# Patient Record
Sex: Male | Born: 1955 | Race: Black or African American | Hispanic: No | Smoking: Former smoker
Health system: Southern US, Community
[De-identification: ages and names within clinical notes are randomized; demographics above are authoritative.]

## PROBLEM LIST (undated history)

## (undated) DIAGNOSIS — I6529 Occlusion and stenosis of unspecified carotid artery: Secondary | ICD-10-CM

## (undated) DIAGNOSIS — I2510 Atherosclerotic heart disease of native coronary artery without angina pectoris: Secondary | ICD-10-CM

## (undated) DIAGNOSIS — I639 Cerebral infarction, unspecified: Secondary | ICD-10-CM

## (undated) DIAGNOSIS — I10 Essential (primary) hypertension: Secondary | ICD-10-CM

## (undated) DIAGNOSIS — I219 Acute myocardial infarction, unspecified: Secondary | ICD-10-CM

## (undated) HISTORY — DX: Occlusion and stenosis of unspecified carotid artery: I65.29

## (undated) HISTORY — PX: OTHER SURGICAL HISTORY: SHX169

---

## 1999-09-19 ENCOUNTER — Encounter: Payer: Self-pay | Admitting: Emergency Medicine

## 1999-09-19 ENCOUNTER — Observation Stay (HOSPITAL_COMMUNITY): Admission: EM | Admit: 1999-09-19 | Discharge: 1999-09-20 | Payer: Self-pay | Admitting: Emergency Medicine

## 1999-09-20 ENCOUNTER — Encounter: Payer: Self-pay | Admitting: Periodontics

## 2005-12-27 DIAGNOSIS — I219 Acute myocardial infarction, unspecified: Secondary | ICD-10-CM | POA: Insufficient documentation

## 2005-12-27 DIAGNOSIS — I10 Essential (primary) hypertension: Secondary | ICD-10-CM

## 2005-12-27 DIAGNOSIS — I69959 Hemiplegia and hemiparesis following unspecified cerebrovascular disease affecting unspecified side: Secondary | ICD-10-CM | POA: Insufficient documentation

## 2005-12-27 DIAGNOSIS — I639 Cerebral infarction, unspecified: Secondary | ICD-10-CM

## 2005-12-27 HISTORY — DX: Cerebral infarction, unspecified: I63.9

## 2005-12-27 HISTORY — DX: Acute myocardial infarction, unspecified: I21.9

## 2005-12-27 HISTORY — PX: CARDIAC CATHETERIZATION: SHX172

## 2006-09-22 ENCOUNTER — Inpatient Hospital Stay (HOSPITAL_COMMUNITY): Admission: EM | Admit: 2006-09-22 | Discharge: 2006-09-26 | Payer: Self-pay | Admitting: Emergency Medicine

## 2007-03-11 ENCOUNTER — Emergency Department (HOSPITAL_COMMUNITY): Admission: EM | Admit: 2007-03-11 | Discharge: 2007-03-11 | Payer: Self-pay | Admitting: Emergency Medicine

## 2007-03-21 ENCOUNTER — Encounter: Admission: RE | Admit: 2007-03-21 | Discharge: 2007-03-21 | Payer: Self-pay | Admitting: General Surgery

## 2007-03-29 ENCOUNTER — Encounter (HOSPITAL_COMMUNITY): Admission: RE | Admit: 2007-03-29 | Discharge: 2007-03-29 | Payer: Self-pay | Admitting: Cardiology

## 2007-11-21 ENCOUNTER — Ambulatory Visit: Payer: Self-pay | Admitting: Infectious Disease

## 2007-11-21 ENCOUNTER — Inpatient Hospital Stay (HOSPITAL_COMMUNITY): Admission: EM | Admit: 2007-11-21 | Discharge: 2007-11-22 | Payer: Self-pay | Admitting: Emergency Medicine

## 2007-11-21 ENCOUNTER — Encounter (INDEPENDENT_AMBULATORY_CARE_PROVIDER_SITE_OTHER): Payer: Self-pay | Admitting: Emergency Medicine

## 2007-11-22 ENCOUNTER — Ambulatory Visit: Payer: Self-pay | Admitting: *Deleted

## 2007-11-22 ENCOUNTER — Encounter (INDEPENDENT_AMBULATORY_CARE_PROVIDER_SITE_OTHER): Payer: Self-pay | Admitting: Gastroenterology

## 2008-03-23 IMAGING — CT CT PELVIS W/ CM
3 of 5 series · 15 of 32 positions shown, 19 images · IV contrast (OMNI 300/WATER & 100 ML OMNI 300)
Comparison: none

HISTORY: Right lower abdomen pain, rectal bleeding

[Series 2: routine abdomen · axial · 0.86mm/px · z∈[-423,-188]mm · 3 of 93 slices shown, 7 images]
[im 24/93  soft-tissue]
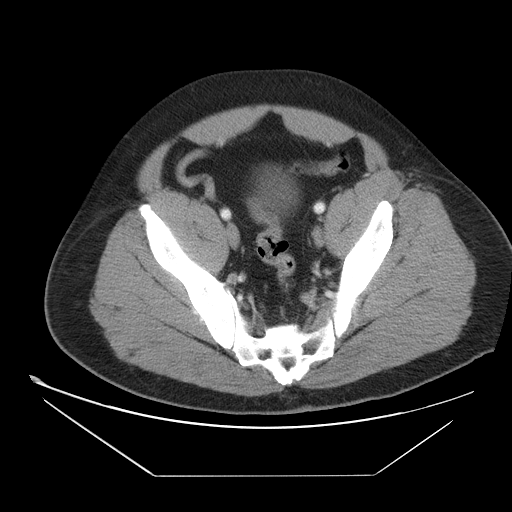
[im 24/93  lung]
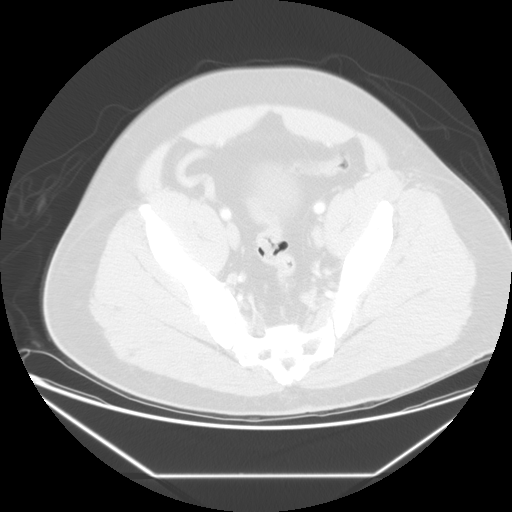
[im 24/93  bone]
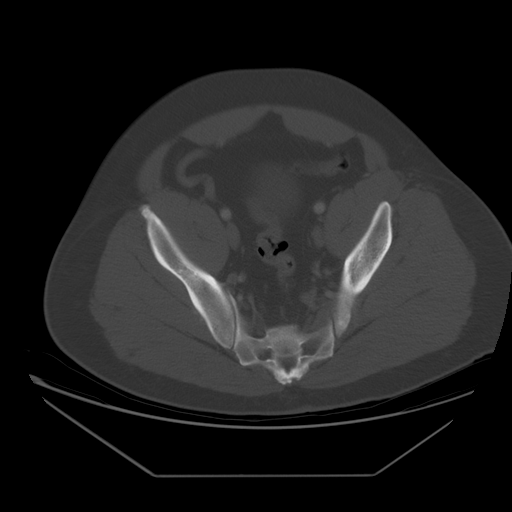
[im 47/93  soft-tissue]
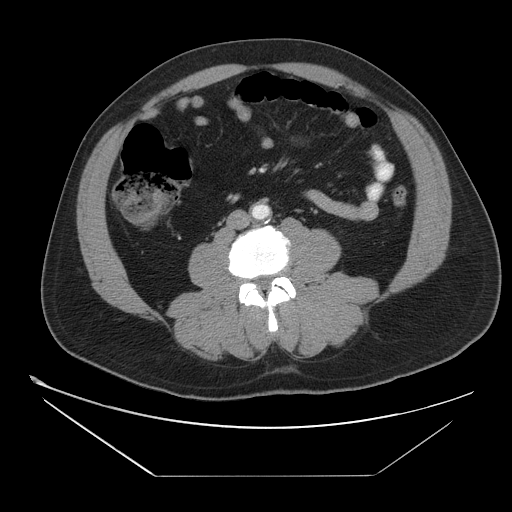
[im 47/93  lung]
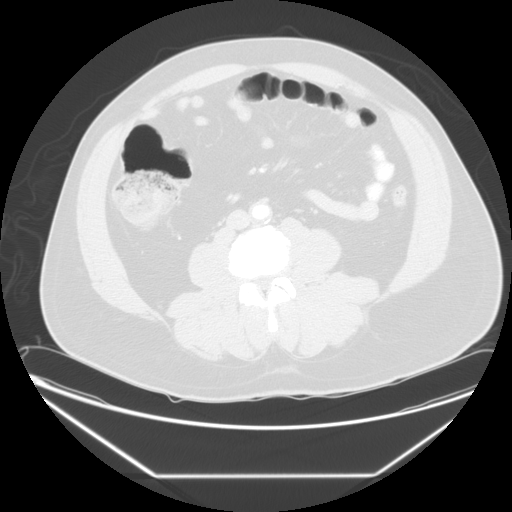
[im 70/93  soft-tissue]
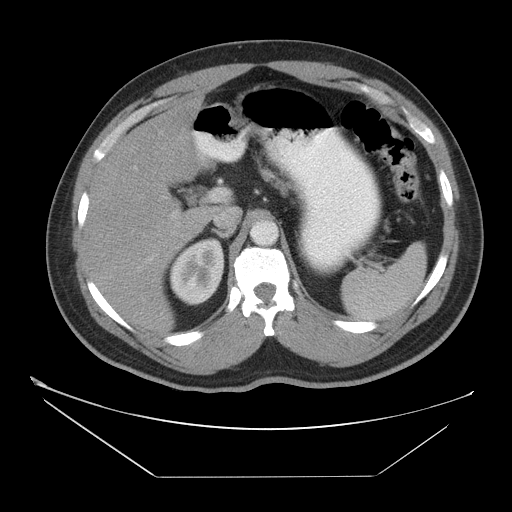
[im 70/93  lung]
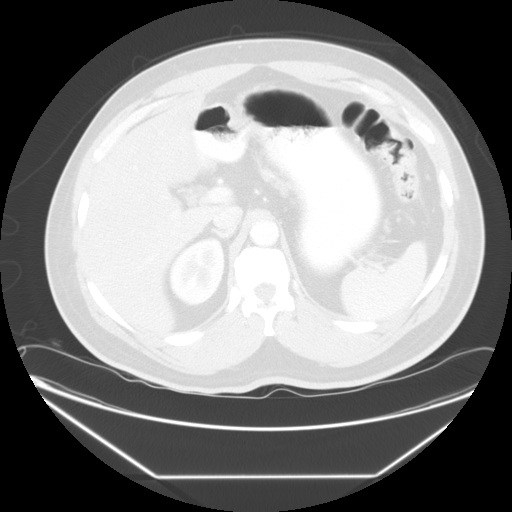

[Series 400: reformatted · sagittal · 0.95mm/px · 8 of 203 slices shown (1 of 2)]
[im 19/203  soft-tissue]
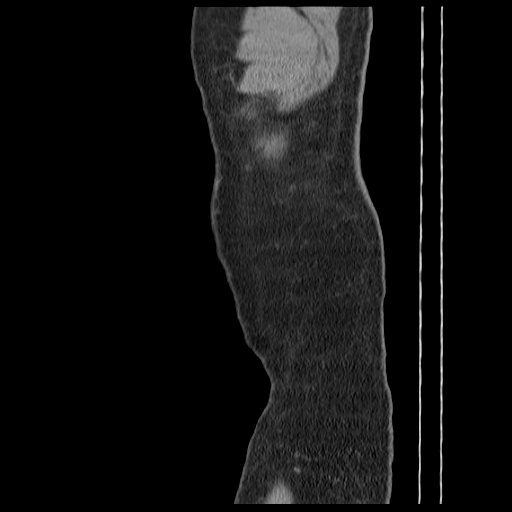
[im 37/203  soft-tissue]
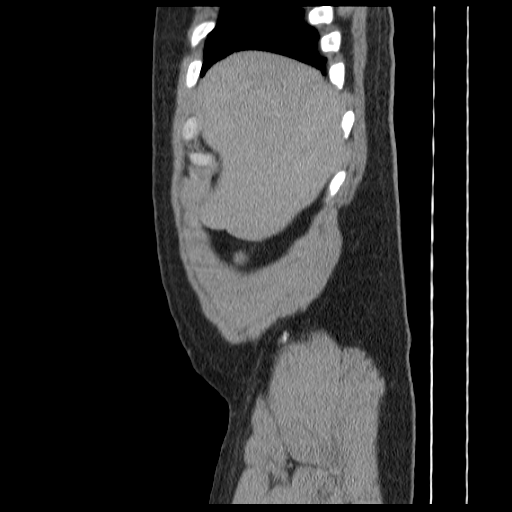
[im 74/203  soft-tissue]
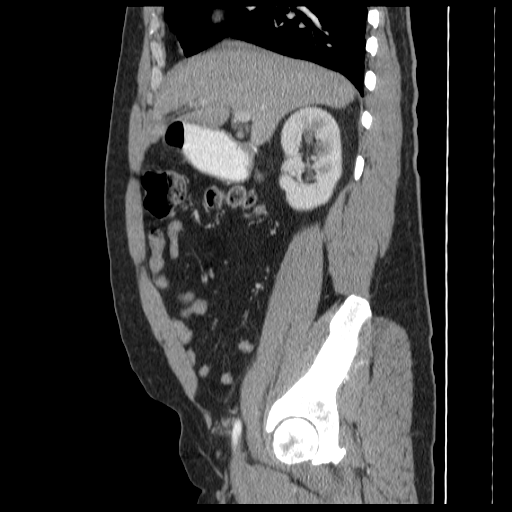
[im 92/203  soft-tissue]
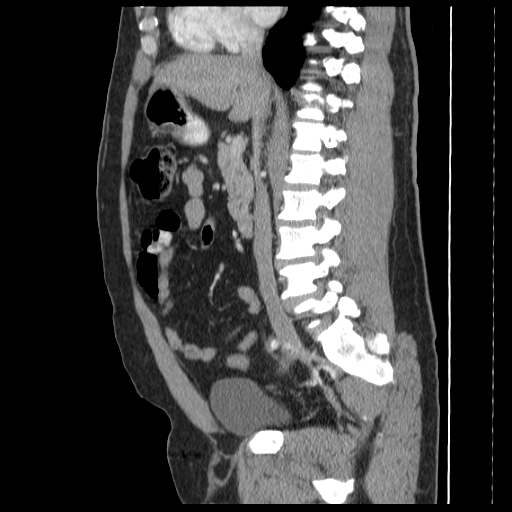
[im 111/203  soft-tissue]
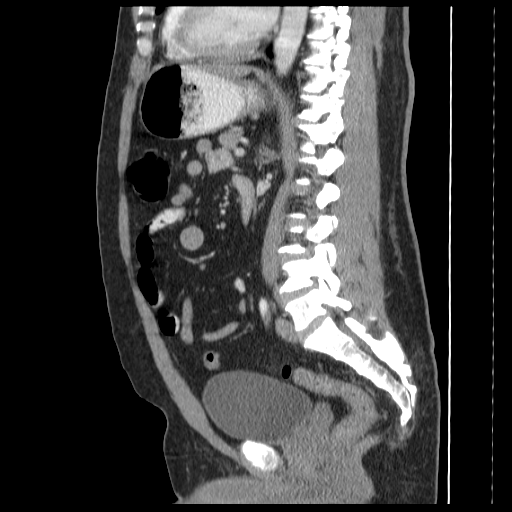
[im 129/203  soft-tissue]
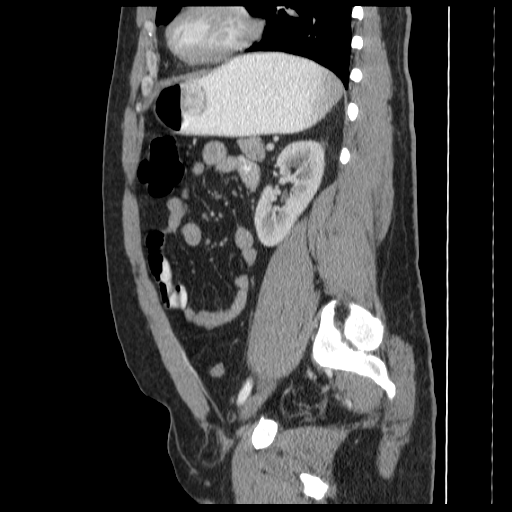
[im 166/203  soft-tissue]
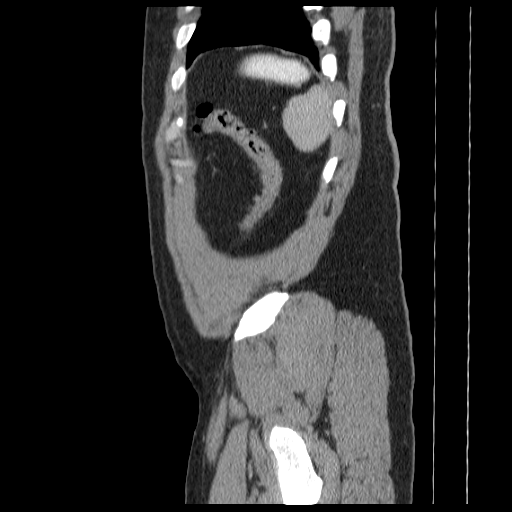
[im 184/203  soft-tissue]
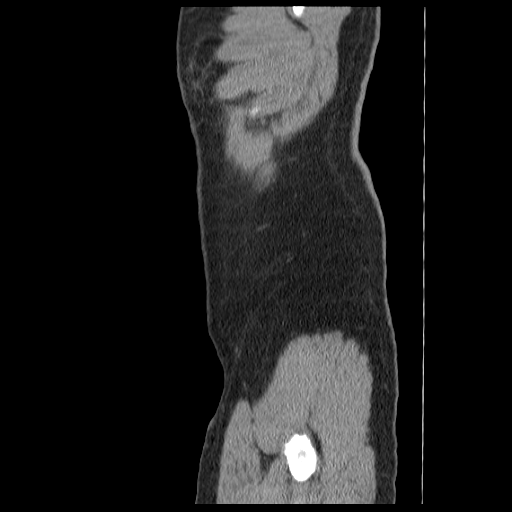

[Series 401: reformatted · coronal · 0.95mm/px · 4 of 168 slices shown (2 of 2)]
[im 19/168  soft-tissue]
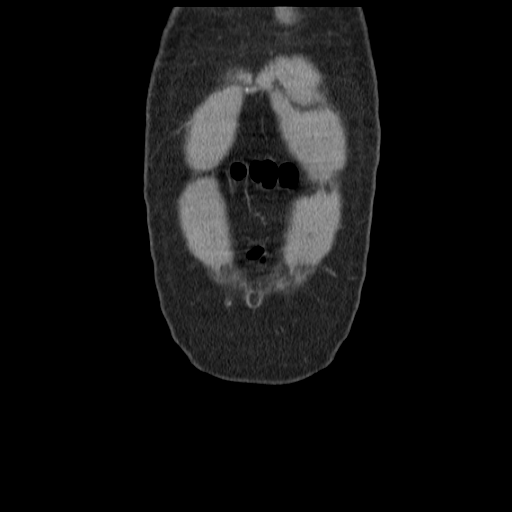
[im 38/168  soft-tissue]
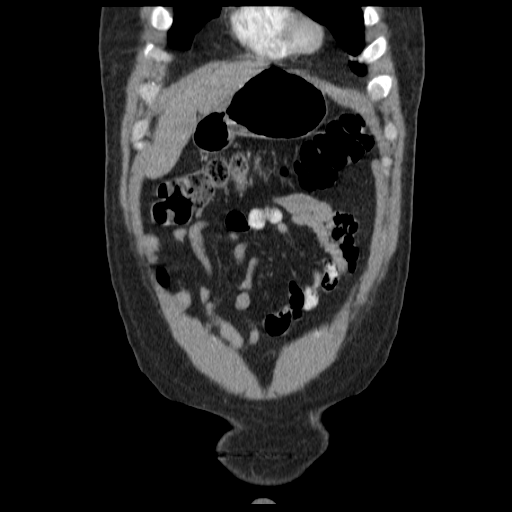
[im 56/168  soft-tissue]
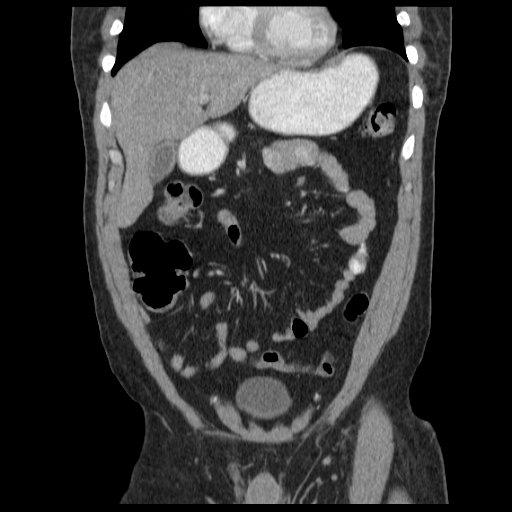
[im 75/168  soft-tissue]
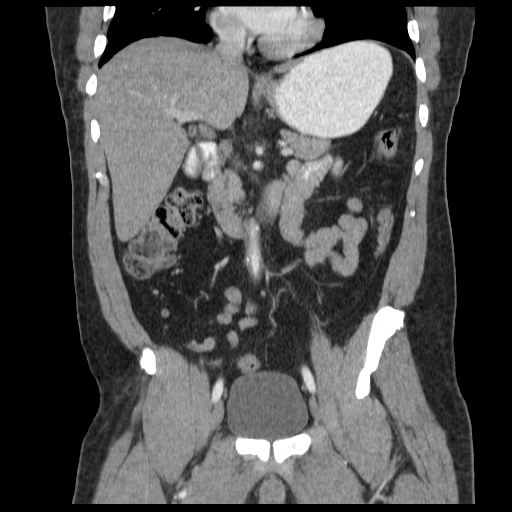

[15 of 32 positions shown; findings below may reference images not displayed]

CT ABDOMEN AND PELVIS WITH CONTRAST:

Multidetector helical CT imaging abdomen and pelvis performed.
Sagittal and coronal images are reconstructed from the axial data set.
Exam utilized dilute oral contrast and 100 cc 7mnipaque-YPP.
Comparison 09/22/2006

CT ABDOMEN:

Mild bibasilar atelectasis in lower lobes.
Bilateral renal cysts, largest in right kidney 2.5 x 1.8 cm image 29.
Nonobstructing calculus mid left kidney, 6 mm diameter image 36.
Partially contracted gallbladder with question thickened wall vs. artifact.
Remainder of liver, spleen, pancreas, kidneys, and adrenal glands normal.
Stomach and small bowel loops normal appearance.
Mild bowel thickening of proximal descending colon, with minimal pericolic
stranding, compatible with segmental colitis.
Remainder of colon unremarkable.
No upper abdominal mass, adenopathy, or free fluid.
IMPRESSION: Bilateral renal cyst with nonobstructing calculus midleft kidney.
Partially distended gallbladder with questionable gallbladder wall versus
artifact, recommend followup ultrasound of gallbladder if there is clinical
concern for hepatobiliary disease. 
Segmental wall thickening of the proximal descending colon with mild pericolic
inflammatory changes compatible with colitis, differential diagnosis includes
inflammatory bowel disease, infection, unlikely ischemia in patient of this age
with minimal vascular disease changes.

CT PELVIS:

Normal appendix.
Remainder of colon and pelvic small bowel loops normal appearance.
Normal appearance of bladder and distal ureters.
No pelvic mass, adenopathy, free fluid, or inflammatory process. 
Bones unremarkable.
IMPRESSION: No additional pelvic abnormalities, see above.

## 2008-05-23 ENCOUNTER — Emergency Department (HOSPITAL_COMMUNITY): Admission: EM | Admit: 2008-05-23 | Discharge: 2008-05-23 | Payer: Self-pay | Admitting: Emergency Medicine

## 2008-09-03 ENCOUNTER — Emergency Department (HOSPITAL_COMMUNITY): Admission: EM | Admit: 2008-09-03 | Discharge: 2008-09-04 | Payer: Self-pay | Admitting: Emergency Medicine

## 2010-05-20 ENCOUNTER — Inpatient Hospital Stay (HOSPITAL_COMMUNITY): Admission: EM | Admit: 2010-05-20 | Discharge: 2010-05-25 | Payer: Self-pay | Admitting: Emergency Medicine

## 2010-05-20 DIAGNOSIS — Z8719 Personal history of other diseases of the digestive system: Secondary | ICD-10-CM

## 2010-05-20 LAB — CONVERTED CEMR LAB
TSH: 2.877 microintl units/mL
Total CHOL/HDL Ratio: 8.8

## 2010-05-21 ENCOUNTER — Encounter (INDEPENDENT_AMBULATORY_CARE_PROVIDER_SITE_OTHER): Payer: Self-pay | Admitting: Cardiology

## 2010-05-21 ENCOUNTER — Ambulatory Visit: Payer: Self-pay | Admitting: Vascular Surgery

## 2010-05-21 ENCOUNTER — Encounter: Payer: Self-pay | Admitting: Cardiology

## 2010-05-24 ENCOUNTER — Encounter (INDEPENDENT_AMBULATORY_CARE_PROVIDER_SITE_OTHER): Payer: Self-pay | Admitting: Internal Medicine

## 2010-05-25 LAB — CONVERTED CEMR LAB
Basophils Absolute: 0 10*3/uL
Basophils Relative: 0 %
Chloride: 108 meq/L
HCT: 38.4 %
Hemoglobin: 12.5 g/dL
Lymphocytes Relative: 23 %
Lymphs Abs: 1.6 10*3/uL
MCV: 90.7 fL
Monocytes Absolute: 0.8 10*3/uL
Neutrophils Relative %: 61 %
Potassium: 3.9 meq/L
RBC: 4.23 M/uL
Sodium: 140 meq/L

## 2010-06-25 ENCOUNTER — Encounter (INDEPENDENT_AMBULATORY_CARE_PROVIDER_SITE_OTHER): Payer: Self-pay | Admitting: Nurse Practitioner

## 2010-06-25 ENCOUNTER — Ambulatory Visit: Payer: Self-pay | Admitting: Internal Medicine

## 2010-06-25 DIAGNOSIS — F17200 Nicotine dependence, unspecified, uncomplicated: Secondary | ICD-10-CM

## 2010-06-25 DIAGNOSIS — Z87442 Personal history of urinary calculi: Secondary | ICD-10-CM | POA: Insufficient documentation

## 2010-06-26 DIAGNOSIS — I739 Peripheral vascular disease, unspecified: Secondary | ICD-10-CM

## 2010-08-20 ENCOUNTER — Ambulatory Visit: Payer: Self-pay | Admitting: Nurse Practitioner

## 2011-01-07 ENCOUNTER — Encounter (HOSPITAL_COMMUNITY)
Admission: RE | Admit: 2011-01-07 | Discharge: 2011-01-26 | Payer: Self-pay | Source: Home / Self Care | Attending: Cardiology | Admitting: Cardiology
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
## 2011-01-26 NOTE — Letter (Signed)
Summary: PT INFORMATION SHEET  PT INFORMATION SHEET   Imported By: Arta Bruce 07/10/2010 12:42:14  _____________________________________________________________________  External Attachment:    Type:   Image     Comment:   External Document

## 2011-03-15 LAB — LIPID PANEL
Cholesterol: 256 mg/dL — ABNORMAL HIGH (ref 0–200)
HDL: 29 mg/dL — ABNORMAL LOW (ref >39)
Triglycerides: 132 mg/dL (ref <150)
VLDL: 26 mg/dL (ref 0–40)

## 2011-03-15 LAB — URINALYSIS, ROUTINE W REFLEX MICROSCOPIC
Bilirubin Urine: NEGATIVE
Glucose, UA: NEGATIVE mg/dL
Hgb urine dipstick: NEGATIVE
Ketones, ur: NEGATIVE mg/dL
Protein, ur: NEGATIVE mg/dL
Urobilinogen, UA: 0.2 mg/dL (ref 0.0–1.0)

## 2011-03-15 LAB — CBC
HCT: 38.4 % — ABNORMAL LOW (ref 39.0–52.0)
HCT: 40 % (ref 39.0–52.0)
HCT: 41 % (ref 39.0–52.0)
HCT: 44.5 % (ref 39.0–52.0)
Hemoglobin: 13.4 g/dL (ref 13.0–17.0)
Hemoglobin: 14.5 g/dL (ref 13.0–17.0)
MCHC: 32.5 g/dL (ref 30.0–36.0)
MCHC: 32.6 g/dL (ref 30.0–36.0)
MCHC: 33.1 g/dL (ref 30.0–36.0)
MCHC: 33.4 g/dL (ref 30.0–36.0)
MCV: 89.1 fL (ref 78.0–100.0)
MCV: 89.2 fL (ref 78.0–100.0)
MCV: 90.3 fL (ref 78.0–100.0)
MCV: 90.7 fL (ref 78.0–100.0)
Platelets: 335 10*3/uL (ref 150–400)
Platelets: 358 10*3/uL (ref 150–400)
RBC: 4.23 MIL/uL (ref 4.22–5.81)
RBC: 4.43 MIL/uL (ref 4.22–5.81)
RBC: 5 MIL/uL (ref 4.22–5.81)
RDW: 15.5 % (ref 11.5–15.5)
WBC: 6.9 10*3/uL (ref 4.0–10.5)
WBC: 7 10*3/uL (ref 4.0–10.5)
WBC: 8.5 10*3/uL (ref 4.0–10.5)

## 2011-03-15 LAB — BASIC METABOLIC PANEL
BUN: 4 mg/dL — ABNORMAL LOW (ref 6–23)
CO2: 27 mEq/L (ref 19–32)
Calcium: 8.5 mg/dL (ref 8.4–10.5)
Chloride: 108 mEq/L (ref 96–112)
Chloride: 108 mEq/L (ref 96–112)
Creatinine, Ser: 1.13 mg/dL (ref 0.4–1.5)
GFR calc Af Amer: >60 mL/min (ref >60)
GFR calc Af Amer: >60 mL/min (ref >60)
GFR calc Af Amer: >60 mL/min (ref >60)
GFR calc non Af Amer: 57 mL/min — ABNORMAL LOW (ref >60)
GFR calc non Af Amer: >60 mL/min (ref >60)
Glucose, Bld: 76 mg/dL (ref 70–99)
Potassium: 3.6 mEq/L (ref 3.5–5.1)
Potassium: 3.9 mEq/L (ref 3.5–5.1)
Potassium: 3.9 mEq/L (ref 3.5–5.1)
Sodium: 140 mEq/L (ref 135–145)

## 2011-03-15 LAB — TESTOSTERONE: Testosterone: 243.98 ng/dL — ABNORMAL LOW (ref 350–890)

## 2011-03-15 LAB — COMPREHENSIVE METABOLIC PANEL
ALT: 19 U/L (ref 0–53)
Albumin: 3.3 g/dL — ABNORMAL LOW (ref 3.5–5.2)
Albumin: 3.7 g/dL (ref 3.5–5.2)
Alkaline Phosphatase: 88 U/L (ref 39–117)
BUN: 6 mg/dL (ref 6–23)
BUN: 9 mg/dL (ref 6–23)
Calcium: 8.4 mg/dL (ref 8.4–10.5)
Calcium: 9 mg/dL (ref 8.4–10.5)
Creatinine, Ser: 1.01 mg/dL (ref 0.4–1.5)
GFR calc Af Amer: >60 mL/min (ref >60)
Potassium: 3.4 mEq/L — ABNORMAL LOW (ref 3.5–5.1)
Total Protein: 6.8 g/dL (ref 6.0–8.3)
Total Protein: 7.4 g/dL (ref 6.0–8.3)

## 2011-03-15 LAB — DIFFERENTIAL
Basophils Relative: 0 % (ref 0–1)
Eosinophils Absolute: 0.3 10*3/uL (ref 0.0–0.7)
Eosinophils Relative: 4 % (ref 0–5)
Lymphs Abs: 1.4 10*3/uL (ref 0.7–4.0)
Lymphs Abs: 1.6 10*3/uL (ref 0.7–4.0)
Monocytes Relative: 12 % (ref 3–12)
Neutro Abs: 6 10*3/uL (ref 1.7–7.7)
Neutrophils Relative %: 61 % (ref 43–77)

## 2011-03-15 LAB — TSH: TSH: 2.877 u[IU]/mL (ref 0.350–4.500)

## 2011-03-15 LAB — GLUCOSE, CAPILLARY: Glucose-Capillary: 116 mg/dL — ABNORMAL HIGH (ref 70–99)

## 2011-03-15 LAB — LIPASE, BLOOD: Lipase: 26 U/L (ref 11–59)

## 2011-05-11 NOTE — Op Note (Signed)
NAMENORVELL, CASWELL             ACCOUNT NO.:  [id number]   MEDICAL RECORD NO.:  [id number]          PATIENT TYPE:  INP   LOCATION:  3733                         FACILITY:  [facility]   PHYSICIAN:  John C. Madilyn Fireman, M.D.    DATE OF BIRTH:  10/20/1956   DATE OF PROCEDURE:  DATE OF DISCHARGE:                               OPERATIVE REPORT   PROCEDURE:  Colonoscopy with polypectomy.   ENDOSCOPIST:  Everardo All. Madilyn Fireman, M.D.   INDICATIONS FOR PROCEDURE:  Rectal bleeding and perianal pain in a 55-  year-old patient with no prior colon screening.   PROCEDURE:  The patient was placed in the left lateral decubitus  position and placed on pulse monitor with continuous low-flow oxygen  delivered by nasal cannula.  He was sedated with 75 mcg of IV fentanyl  and 7 mg of IV Versed.  The Olympus video colonoscope was inserted into  the rectum and advanced to cecum, confirmed by transillumination of  McBurney's point and visualization of ileocecal valve and appendiceal  orifice.  The prep was excellent.  Within the cecum, there were seen an  8-mm polyp, which was removed by snare.  In the proximal ascending  colon, there was a 6-mm polyp removed by snare.  The remainder of the  ascending, transverse, descending, sigmoid and rectum appeared normal  down to the anus.  Retroflexed view reveals some small internal  hemorrhoids.  On direct inspection, there was an anal fissure at the 12  o'clock position which was photographed.  The scope was then withdrawn  and the patient returned to the recovery room in stable condition.  He  tolerated the procedure well and there were no immediate complications.   IMPRESSION:  1. Anal fissure.  2. Two colon polyps in the ascending and cecum, respectively.   PLAN:  We will await pathology and treat with diltiazem gel.           ______________________________  Everardo All. Madilyn Fireman, M.D.     JCH/MEDQ  D:  11/22/2007  T:  11/22/2007  Job:  [id number]   cc:   Acey Lav,  MD

## 2011-05-11 NOTE — Consult Note (Signed)
[name], Petty             ACCOUNT NO.:  [id number]   MEDICAL RECORD NO.:  [id number]          PATIENT TYPE:  INP   LOCATION:  3733                         FACILITY:  [facility]   PHYSICIAN:  John C. Madilyn Fireman, M.D.    DATE OF BIRTH:  10/20/1956   DATE OF CONSULTATION:  11/21/2007  DATE OF DISCHARGE:                                 CONSULTATION   We are asked to see [name] today, which is November 21, 2007, by  Dr. Paulette Blanch Dam of the [facility] in consultation  for rectal bleed.   HISTORY OF PRESENT ILLNESS:  This is a 55 year old male with a history  of coronary artery disease.  He had a myocardial infarction in 2007 and  is status post nondrug-eluting stent placement.  He had bright red blood  per rectum last night in his stool as well as in the toilet water while  having a very difficult bowel movement.  He tells me that he normally  takes Colace every day but, if he forgets it for several days in a row,  he will become very constipated and have difficult bowel movements.  His  primary complaint right now is rectal pain.  He tells me that the 2  rectal exams he had in the ER were excruciating and he is lying in bed  now with his rectum slightly elevated off of the bed.  He had similar  symptoms three month ago in [place], [place] with bright red  blood per rectum and went to the hospital.  However, he states they just  gave him pain medications and released him.  He did not have a workup.  He tells me that his appetite has been good.  He has no weight loss.  No  heartburn.  His bowel movements, if he is taking his Colace daily, are  brown and easy to pass.  He has no melena.  He reports that he takes  Tylenol for pain.  However, he also takes an 81 mg baby aspirin.  Currently, he is having some epigastric pain but the pain is primarily  in his rectum as well as his lower quadrant of his abdomen.  He also  reports increased urinary frequency,  particularly at night, with some  burning.  He tells me that he feels like he cannot completely empty his  bladder.  He reports no fever, shortness of breath, or palpitations.   PAST MEDICAL HISTORY:  Is significant for:  1. An MI with a nondrug-eluting stent placement in September 2007 by      Dr. Jacinto Halim, who is his cardiologist.  2. He also has a history of cholelithiasis and cholecystitis for which      he has not had surgery.  3. He has a history of hypertension.  4. Coronary artery disease.  5. Dyslipidemia.   He has no known drug allergies.   CURRENT MEDICATIONS:  Lisinopril, Toprol, Colace daily, Prilosec, and an  81 mg aspirin.  He takes Tylenol occasionally for headaches.   ON PHYSICAL EXAM:  He is alert and oriented but uncomfortable.  HEART:  Has a regular rate and rhythm.  LUNGS:  Are clear to auscultation anteriorly.  ABDOMEN:  Is nondistended.  He does have some firmness in his lower  quadrants but feels like it could be stool.  He has decreased bowel  sounds.  Tender in his lower quadrants bilaterally.  The patient refused a rectal exam but, per Dr. Landis Martins, he has no external  hemorrhoids and is guaiac positive from his exam in the ER.   FAMILY HISTORY:  Is significant for an uncle who passed away with colon  cancer, an aunt who passed with stomach cancer.   SOCIAL HISTORY:  Is significant for a 30 year tobacco history, says he  currently smokes only about a pack per week.  Rare alcohol use.  No drug  use.  [place] and is in town visiting  relatives.   LABS:  Show a hemoglobin of 12.9, hematocrit 39.2, white count 7.6,  platelets are 460,000.  Coags are normal. LFTs are normal.  Troponin is  normal.  BMET is within normal limits.   On CT scan today, he had:  1. A partially distended gallbladder.  2. Segmental wall thickening with mild pericolic inflammatory changes      consistent with colitis, infectious versus IBD versus  ischemic.   ASSESSMENT:  Dr. Dorena Cookey has seen and examined the patient and  collected a history and reviewed his chart.  His impression is that this  is a 55 year old male with a history of coronary artery disease who has  colitis on computerized tomography, likely ischemic as there is no fever  or increased white count.  However, he is in severe rectal pain, could  be a rectal fissure.  He is also having urinary symptoms that could be a  urinary tract infection versus prostate problems.  Will proceed with  preparations for colonoscopy tomorrow and take measures to make the  patient as comfortable as possible during his prep including using  dibucaine topical gel and Tucks pads.  Thanks very much for this  consultation.      Stephani Police, PA    ______________________________  Everardo All Madilyn Fireman, M.D.    MLY/MEDQ  D:  11/21/2007  T:  11/21/2007  Job:  [id number]   cc:   Acey Lav, MD  Cristy Hilts. Jacinto Halim, MD

## 2011-05-14 NOTE — Discharge Summary (Signed)
Cameron Petty, Cameron Petty NO.:  [id number]   MEDICAL RECORD NO.:  [id number]          PATIENT TYPE:  INP   LOCATION:  3733                         FACILITY:  [facility]   PHYSICIAN:  Acey Lav, MD  DATE OF BIRTH:  10/20/1956   DATE OF ADMISSION:  11/21/2007  DATE OF DISCHARGE:  11/22/2007                               DISCHARGE SUMMARY   SUBJECTIVE:  Cameron Petty.   PRIMARY CARE PHYSICIAN:  None.   CARDIOLOGIST:  Dr. Jacinto Halim at [facility].   DISCHARGE DIAGNOSES:  1. Anal fissure presenting with bright red blood per rectum.  2. Chest pain, likely angina without acute coronary syndrome.  3. Claudication with normal ABIs at rest.  4. Hypertension.  5. Hyperlipidemia.   DISCHARGE MEDICATIONS:  1. Diltiazem gel b.i.d. for 3 weeks.  2. Colace twice a day for 3 weeks.  3. Lisinopril 20 mg daily.  4. Metoprolol 100 mg daily.  5. Zocor 20 mg daily.   DISPOSITION AND FOLLOWUP:  1. Follow up in outpatient clinic with Dr. Laurey Morale on December      12 at 2:30 p.m.  Please check a basic metabolic panel at that time.      Please also check a fasting lipid profile and a set of liver      function tests in 6 weeks' time.  2. Also, follow up with Dr. Jacinto Halim, the cardiologist, on December 12 at      9:30 a.m. to evaluate chest pain.   PROCEDURE PERFORMED:  Colonoscopy to rule out ischemic colitis.   CONSULTATIONS:  Dr. Dorena Cookey of gastroenterology.   ADMISSION HISTORY:  A 55 year old African American male with a history  of CAD, status post myocardial infarction in October 2007, who presents  with one day of bright red blood per rectum.  The patient was at his  normal state of health until he felt an urge to defecate the night  before admission, and went on to pass bright red blood per rectum  followed by a large, hard stool, and then followed by more bright red  blood.  He reports that he is chronically constipated and requires 2  stool softeners a day, which he did not take for several days prior to  admission.  The patient also reports he had not had a bowel movement for  2 days before the episode of rectal bleeding.  The patient reports a  prior episode similar to this several months ago when he stopped taking  his stool softener.  He went to the hospital in [place], but per the  patient's report, did not receive a full workup.   On admission, the patient reports feeling diaphoretic the morning of  admission, as well as fatigued.  He experienced some chest pain in the  emergency department, but reports that he does get chest pain with  exertion or stress.  He has been having more chest pain on exertion this  year than previously.  Also of note, the patient reports that his legs  have been hurting upon walking.  He can normally walk about a  block  until he experiences pain in his legs, mostly in his calf.   PHYSICAL EXAMINATION:  VITAL SIGNS:  Temperature 97.8.  Blood pressure  152/90 with a followup blood pressure of 109/58 after given Dilaudid for  pain.  Pulse is 96, respirations 20, O2 saturation 97% on room air.  GENERAL:  Alert and oriented x3, no acute distress.  Appears  uncomfortable on exam.  EYES:  Extraocular muscles intact.  Pupils equal, round and reactive to  light.  Anicteric.  ENT:  Oropharynx clear.  Moist mucous membranes.  Poor dentition with  missing teeth.  NECK:  Supple.  No lymphadenopathy.  RESPIRATORY:  Clear to auscultation bilaterally.  CARDIOVASCULAR:  Regular rate and rhythm with no murmurs appreciated.  GI:  Voluntary guarding, positive bowel sounds.  Tender to palpation  diffusely, but mainly in the lower abdomen.  EXTREMITIES:  No clubbing, cyanosis or edema.  No calf pain.  GU:  No CVA tenderness.  No external hemorrhoids.  Tender upon spreading  buttocks for external exam.  Positive hemoccult.  Internal exam not  performed due to pain.  SKIN:  Warm and dry.  LYMPH:  No  lymphadenopathy.  MUSCULOSKELETAL:  Moving all four extremities.  NEURO:  Grossly nonfocal.  PSYCHIATRIC:  Appropriate.   ADMISSION LABORATORY DATA:  Sodium 140, potassium 3.9, chloride 106,  bicarb 26, BUN 9, creatinine 1.2, glucose 103.  CBC:  White blood count  7.8, hemoglobin 13.3, hematocrit 40, platelets 449, MCV 86, ANC 3.9.  TSH 3.73.   CT of abdomen and pelvis showed bilateral renal cysts with  nonobstructing calculus mid-left kidney.  Partially distended  gallbladder with possible gallbladder wall thickening.  Segmental wall  thickening of proximal descending colon with mild pericolic inflammation  changes consistent with colitis.  No pelvic abnormality.   HOSPITAL COURSE:  1. Bright red blood per rectum in this patient who is worrisome for      ischemic colitis given the patient's cardiac history and his      symptoms of claudication.  However, after a negative colonoscopy,      it was determined to be caused by anal fissure.  Colonoscopy was      performed and found two polyps incidentally, which were removed.      The polyps were not thought to be the source of the bleeding.      Diltiazem gel was prescribed for the anal fissure.  2. Chest pain:  The patient reported chest pain in the emergency      department.  EKG was obtained and was normal.  Cardiac enzymes were      cycled, and were normal as well.  Chest pain experienced in the      emergency department is likely due to angina.  The pain resolved      shortly after.  The patient's report of increasing frequency of      chest pain with exertion and during times of stress is worrisome      for worsening angina.  The patient reports see Dr. Jacinto Halim as his      cardiologist, and will follow up with Dr. Jacinto Halim as an outpatient.      He will also need lifestyle modification to improve his blood      pressure, lipids, as well as tobacco cessation.  3. Claudication:  The patient complained of leg pain with ambulation.       Given his cardiac history, symptoms consistent with claudication.  ABI was performed and was normal at rest.  Followup may be needed.  4. Hypertension:  Blood pressure goal of 130/80.  The patient was      hypertensive on admission at 152/90.  Upon Dilaudid administration,      the patient's blood pressure normalized.  The patient was supposed      to be on metoprolol and lisinopril, and reported only taking      lisinopril.  The patient was prescribed metoprolol and lisinopril      on discharge.  5. Hyperlipidemia:  The patient has a history of hyperlipidemia, and      was supposed to be on simvastatin.  He had not been taking this.      His LDL goal was not met, and his current LDL is at 183 on      admission.  His goal LDL is less than 70.  Liver function tests      were normal during this admission, and the patient was prescribed      Zocor for hyperlipidemia.  Fasting lipid profile and LFTs should be      repeated in 6 weeks.   DISCHARGE LABORATORY DATA:  Sodium 141, potassium 4.3, chloride 111,  bicarb 28, BUN 5, creatinine 1.12, glucose 90.  CBC:  White blood cells  7.7, hemoglobin 11.7, hematocrit 35.2, platelets 391.  Fasting lipid  panel:  Total cholesterol 250, HDL 23, LDL 183, triglycerides 221.   DISCHARGE VITAL SIGNS:  Temperature 98.0, blood pressure 124/76, pulse  83, respirations 18, O2 saturation 99% on room air.      Olene Craven, M.D.  Electronically Signed      Acey Lav, MD  Electronically Signed    MC/MEDQ  D:  11/24/2007  T:  11/25/2007  Job:  [id number]   cc:   Cristy Hilts. Jacinto Halim, MD  Everardo All Madilyn Fireman, M.D.

## 2011-05-14 NOTE — Consult Note (Signed)
Cameron, Petty NO.:  [id number]   MEDICAL RECORD NO.:  [id number]          PATIENT TYPE:  INP   LOCATION:  2027                         FACILITY:  [facility]   PHYSICIAN:  Thornton Park. Daphine Deutscher, MD  DATE OF BIRTH:  10/20/1956   DATE OF CONSULTATION:  09/22/2006  DATE OF DISCHARGE:                                   CONSULTATION   CHIEF COMPLAINT:  Chest and abdominal pain x2 days.   HISTORY:  This is a 55 year old black male with a 2-day history of chest  pain and upper abdominal pain.  His wife indicated that he has really had a  couple day history of chest pain where he has had some shortness of breath  and no nausea or vomiting.  Today I think the pain may have gotten much more  severe and he was brought to the ER for further evaluation.   PAST MEDICAL HISTORY:  Remarkable in that he does not have a primary care  doctor.  He denies any history of diabetes or hypertension.   HE HAS NO KNOWN ALLERGIES.   FAMILY HISTORY:  Positive for father that died of an MI at an early age.   SOCIAL HISTORY:  He is married and his wife accompanies him.  He still  smokes cigarettes.   PHYSICAL EXAMINATION:  Blood pressure is 140/80, pulse 90, respirations 22.  His physical exam is remarkable to me in that in addition to the subjective  chest pain he is having, he has some tenderness to palpation mainly around  his umbilicus and going up in his right upper quadrant.   His laboratory is remarkable in that his white count is not elevated.  His  hemoglobin is 14.5.  His BNP is less than 30.  His LFTs are all within  normal limits.  His D-dimer is 0.48.  His EKG had some nonspecific ST  changes.  His CK-MB was markedly positive with myoglobin.  He also had  elevated troponin-I's.   CT scan was reviewed by me and shows what appears to be a contracted  gallbladder with probably thickened gallbladder walls.  I would agree with  ultrasound to better assess his gallbladder.  He does  have an enlarged  prostate also on that exam.   RECOMMENDATIONS:  I think the patient should have a complete cardiac workup  and if that is clear, then consideration could be given toward  cholecystectomy if he proves to have cholecystitis by his ultrasound.  Will  treat with antibiotics and his gallbladder for now (first generation  cephalosporin) and will follow up with an ultrasound the gallbladder with he  is more stable.     Thornton Park Daphine Deutscher, MD  Electronically Signed    MBM/MEDQ  D:  09/22/2006  T:  09/24/2006  Job:  [id number]

## 2011-05-14 NOTE — Cardiovascular Report (Signed)
NAMEYOUCEF, KLAS             ACCOUNT NO.:  [id number]   MEDICAL RECORD NO.:  [id number]          PATIENT TYPE:  INP   LOCATION:  4731                         FACILITY:  [facility]   PHYSICIAN:  Darlin Priestly, MD  DATE OF BIRTH:  10/20/1956   DATE OF PROCEDURE:  09/23/2006  DATE OF DISCHARGE:                              CARDIAC CATHETERIZATION   PROCEDURE:  1. Left heart catheterization.  2. Coronary angiography.  3. Left ventriculogram.  4. OM-1 - proximal.  - placed on intracoronary stent.   ATTENDING PHYSICIAN:  Darlin Priestly, MD.   COMPLICATIONS:  None.   INDICATIONS:  Mr. Akhtar is a 55 year old male, who was brought to the  emergency room on September 22, 2006, complaining of abdominal and chest  pain.  He did subsequently rule in for a non-Q-wave MI.  He was also found  to have a probable cholecystitis confirmed by CT scan and ultrasound.  He is  now brought for a cardiac catheterization to assess his coronary status.   DESCRIPTION OF OPERATION:  After getting informed written consent, the  patient was brought to the cardiac cath lab, and the right groin was shaved,  prepped, and draped in the usual fashion.  An ECG monitor was established.  Using the modified Seldinger technique, a #6-French arterial sheath inserted  in the right femoral artery.  A 6-French diagnostic catheter was used to  perform diagnostic angiography.   The left main is a large vessel with no significant disease.   The LAD is a large vessel and coursed to two small to medium-sized diagonal  branches.  The LAD is noted to have diffuse 60% disease extending across the  first and second diagonal branches and then into the early mid segment.  The  remainder of the LAD has no significant disease.   The first and second diagonals are small to medium-sized vessels with no  significant disease.   The left circumflex is a large vessel that coursed to the AV groove and  gives rise to one  large obtuse marginal branch.  The AV groove circumflex  has no significant disease.   The first OM is a large vessel, which bifurcates distally with a 95% lesion  noted in its proximal segment.   The right coronary artery is a large vessel, which is dominant and gives  rise to a PDA as well as a posterolateral branch.  There is no significant  disease in the RCA, PDA, or posterolateral branch.   Left ventriculogram reveals a low-normal EF of approximately 50% with mild  apical hypokinesis.   Abdominal aortogram reveals no significant renal artery stenosis.   Right iliac angiogram reveals a 50% lesion in the mid right iliac, which  appears ulcerated and aneurysmal.   HEMODYNAMICS:  Systolic arterial pressure 150/91, LV systolic pressure  145/13, LVEDP of 23.   INTERVENTIONAL PROCEDURE:  OM-1 - proximal:  Following diagnostic  angiography, a #6-French XB 3.5 guiding catheter __________ patient in the  left coronary ostium.  Next, a 0.014 ATW marker wire was advanced down the  guiding catheter and used to cross  the OM stenotic lesion without difficulty  and positioned in the distal limb.  Measurements obtained with a marker  wire.  Prior to the intervention, I did discuss with Dr. Johna Sheriff the  possible need for operative treatment of his cholecystitis, and the decision  was made to proceed with a coronary intervention as needed given the fact  that he did rule in for an MI and was a high perioperative risk.  Given that  fact, we did place a non-drug-eluting stent (a Driver 3.0 x 12) across the  stenotic lesion.  The stent was then deployed to 12 atmospheres for a total  of 23 seconds.  A second inflation to 16 atmospheres was performed for a  total of 17 seconds.  Followup angiogram revealed no evidence of dissection  or trauma with TIMI-3 flow to the distal vessel.  IV Angiomax was used  throughout the case.   Final orthogonal angiograms revealed less than a 10% residual stenosis  in  the proximal OM-1 stenotic lesion with TIMI-3 flow to the distal vessel.  At  this point, we elected to conclude the procedure.  All balloons, __________  , and catheters were removed.  A hemostatic sheath was sewn in place, and  the patient returned back to the ward in stable condition.   CONCLUSIONS:  1. Successful placement of a Driver 3.0 x 12 mm (non-drug-eluting stent)      in the proximal first obtuse marginal stenotic lesion.  2. Non-critical disease of the left anterior descending.  3. Low to normal ejection fraction with wall motion abnormalities as noted      above.  4. Fifty percent right iliac aneurysm.  5. Systemic hypertension.  6. Elevated left ventricular end diastolic pressure.  7. __________ Angiomax infusion.      Darlin Priestly, MD  Electronically Signed     RHM/MEDQ  D:  09/23/2006  T:  09/25/2006  Job:  [id number]

## 2011-05-14 NOTE — Discharge Summary (Signed)
NAMEANTARIO, YASUDA NO.:  [id number]   MEDICAL RECORD NO.:  [id number]          PATIENT TYPE:  INP   LOCATION:  2027                         FACILITY:  [facility]   PHYSICIAN:  Cristy Hilts. Jacinto Halim, MD       DATE OF BIRTH:  10/20/1956   DATE OF ADMISSION:  09/22/2006  DATE OF DISCHARGE:  09/26/2006                                 DISCHARGE SUMMARY   DISCHARGE DIAGNOSES:  1. Subendocardial myocardial infarction this admission with nondrug-      eluting stenting to the obtuse marginal-1 by Dr. Jenne Campus.  2. Acute cholecystitis:  Plan is for conservative therapy until surgery      could be safely done in 2-3 months.  3. History smoking.  4. Treated dyslipidemia.   HOSPITAL COURSE:  Mr. Habibi is a 55 year old male. with no prior medical  history, who presented September 22, 2006 with abdominal pain and chest  pain.  His symptoms were worrisome for unstable angina.  His enzymes were  positive.  It also appeared he had concomitant acute cholecystitis and was  seen in consult by Dr. Luretha Murphy.  CT scan of the abdomen reveals a  contracted gallbladder and thickened gallbladder walls.  The patient was  placed on IV heparin as his enzymes came back at 680 with 67 MBs and a  troponin of 2.9.  He underwent catheterization September 23, 2006 which  revealed a normal RCA, normal left main, normal circumflex, 60% LAD and 95%  OM1.  He underwent PTCA and stenting with a driver stent by Dr. Jenne Campus.  Plan at this point is for continued conservative therapy of his  cholecystitis with elective cholecystectomy in 2-3 months.  It is  recommended that he stay on Plavix for at least 1 month.  We feel he can be  discharged September 26, 2006.   DISCHARGE MEDICATIONS:  1. Metoprolol 50 mg b.i.d.  2. Coated aspirin once a day.  3. Plavix 75 mg a day for 1 month.  4. Simvastatin 20 mg a day.  5. Nitroglycerin sublingual p.r.n.  6. Augmentin 875 mg b.i.d. for 7 days then Prilosec OTC once a  day.   He will follow up with Dr. Jacinto Halim and with Dr. Daphine Deutscher.   LABS:  Sodium 138, potassium 3.8, BUN 10, creatinine 1.3.  White count 8.9,  hemoglobin 13.6, hematocrit 40.2, platelets 339.  D-dimer was 0.54.  INR is  1.  Liver functions were normal except for an AST of 40 on admission.  CKs  peaked at 680 with 62 MBs.  TSH is 4.03.  PSA 1.30.  Urinalysis is  unremarkable.  EKG shows sinus rhythm with inferior T-wave inversion.  Abdominal ultrasound revealed a contracted gallbladder containing stones,  some fatty infiltration of the liver, no biliary duct dilatation.  Chest x-  ray revealed no acute findings.   DISPOSITION:  The patient discharged in stable condition and will follow up  with Dr. Jacinto Halim and the surgical service as an outpatient.  He needs to be on  Plavix for least 1 month and then can go an aspirin a day.  He will need  elective cholecystectomy in 2-3 months.      Abelino Derrick, P.A.      Cristy Hilts. Jacinto Halim, MD  Electronically Signed    LKK/MEDQ  D:  09/26/2006  T:  09/26/2006  Job:  [id number]   cc:   Cristy Hilts. Jacinto Halim, MD  Thornton Park Daphine Deutscher, MD

## 2011-09-22 LAB — URINALYSIS, ROUTINE W REFLEX MICROSCOPIC
Bilirubin Urine: NEGATIVE
Glucose, UA: NEGATIVE
Hgb urine dipstick: NEGATIVE
Ketones, ur: NEGATIVE
Protein, ur: NEGATIVE
Urobilinogen, UA: 0.2

## 2011-09-29 LAB — URINALYSIS, ROUTINE W REFLEX MICROSCOPIC
Bilirubin Urine: NEGATIVE
Glucose, UA: NEGATIVE
Hgb urine dipstick: NEGATIVE
Ketones, ur: NEGATIVE
Protein, ur: NEGATIVE
pH: 7

## 2011-09-29 LAB — DIFFERENTIAL
Basophils Relative: 1
Eosinophils Relative: 5
Monocytes Absolute: 0.8
Monocytes Relative: 10
Neutro Abs: 4.8

## 2011-09-29 LAB — CBC
HCT: 39.7
Hemoglobin: 13
MCHC: 32.8
MCV: 87.5
RBC: 4.53
RDW: 15.3

## 2011-09-29 LAB — APTT: aPTT: 35

## 2011-09-29 LAB — POCT I-STAT, CHEM 8
BUN: 10
Hemoglobin: 13.6
Potassium: 3.5
Sodium: 142
TCO2: 25

## 2011-09-29 LAB — TYPE AND SCREEN: ABO/RH(D): O POS

## 2011-10-05 LAB — DIFFERENTIAL
Basophils Absolute: 0
Basophils Relative: 1
Eosinophils Absolute: 0.4
Eosinophils Relative: 5
Lymphocytes Relative: 34
Lymphs Abs: 2.6
Monocytes Absolute: 0.8
Monocytes Relative: 11
Neutro Abs: 3.9

## 2011-10-05 LAB — BASIC METABOLIC PANEL
Calcium: 9.2
Chloride: 111
Creatinine, Ser: 1.12
GFR calc Af Amer: >60
GFR calc Af Amer: >60
GFR calc non Af Amer: >60
Potassium: 3.9
Potassium: 4.3
Sodium: 140
Sodium: 141

## 2011-10-05 LAB — CBC
HCT: 35.2 — ABNORMAL LOW
HCT: 37.6 — ABNORMAL LOW
HCT: 39.2
Hemoglobin: 11.7 — ABNORMAL LOW
Hemoglobin: 12.4 — ABNORMAL LOW
Hemoglobin: 13.3
MCHC: 33
MCHC: 33.1
MCV: 86
MCV: 86.1
MCV: 86.1
Platelets: 460 — ABNORMAL HIGH
RBC: 4.09 — ABNORMAL LOW
RBC: 4.37
RBC: 4.72
RDW: 15.4
WBC: 7.7

## 2011-10-05 LAB — HEPATIC FUNCTION PANEL
AST: 18
Albumin: 3.5
Total Bilirubin: 0.6

## 2011-10-05 LAB — RAPID URINE DRUG SCREEN, HOSP PERFORMED
Amphetamines: NOT DETECTED
Benzodiazepines: NOT DETECTED

## 2011-10-05 LAB — LIPID PANEL
Cholesterol: 250 — ABNORMAL HIGH
LDL Cholesterol: 183 — ABNORMAL HIGH
Triglycerides: 221 — ABNORMAL HIGH

## 2011-10-05 LAB — TYPE AND SCREEN
ABO/RH(D): O POS
Antibody Screen: NEGATIVE

## 2011-10-05 LAB — URINALYSIS, ROUTINE W REFLEX MICROSCOPIC
Glucose, UA: NEGATIVE
Hgb urine dipstick: NEGATIVE
Ketones, ur: NEGATIVE
Protein, ur: NEGATIVE
Urobilinogen, UA: 0.2

## 2011-10-05 LAB — CK TOTAL AND CKMB (NOT AT ARMC)
Total CK: 182
Total CK: 189

## 2011-10-05 LAB — TROPONIN I: Troponin I: 0.01

## 2011-10-05 LAB — PROTIME-INR
INR: 1
Prothrombin Time: 12.9

## 2011-10-05 LAB — LACTIC ACID, PLASMA: Lactic Acid, Venous: 1.3

## 2011-10-05 LAB — APTT: aPTT: 33

## 2012-05-06 ENCOUNTER — Other Ambulatory Visit: Payer: Self-pay | Admitting: Cardiology

## 2012-08-10 ENCOUNTER — Inpatient Hospital Stay (HOSPITAL_COMMUNITY)
Admission: EM | Admit: 2012-08-10 | Discharge: 2012-08-15 | DRG: 349 | Disposition: A | Discharge status: Home / Self Care | Payer: Medicaid Other | Attending: General Surgery | Admitting: General Surgery

## 2012-08-10 ENCOUNTER — Emergency Department (HOSPITAL_COMMUNITY): Payer: Medicaid Other

## 2012-08-10 ENCOUNTER — Encounter (HOSPITAL_COMMUNITY): Payer: Self-pay | Admitting: Emergency Medicine

## 2012-08-10 DIAGNOSIS — K612 Anorectal abscess: Secondary | ICD-10-CM

## 2012-08-10 DIAGNOSIS — Z87891 Personal history of nicotine dependence: Secondary | ICD-10-CM

## 2012-08-10 DIAGNOSIS — I10 Essential (primary) hypertension: Secondary | ICD-10-CM | POA: Diagnosis present

## 2012-08-10 DIAGNOSIS — K603 Anal fistula, unspecified: Secondary | ICD-10-CM

## 2012-08-10 DIAGNOSIS — I2510 Atherosclerotic heart disease of native coronary artery without angina pectoris: Secondary | ICD-10-CM | POA: Diagnosis present

## 2012-08-10 DIAGNOSIS — Z9089 Acquired absence of other organs: Secondary | ICD-10-CM

## 2012-08-10 DIAGNOSIS — Z7982 Long term (current) use of aspirin: Secondary | ICD-10-CM

## 2012-08-10 DIAGNOSIS — N529 Male erectile dysfunction, unspecified: Secondary | ICD-10-CM | POA: Diagnosis present

## 2012-08-10 DIAGNOSIS — Z880 Allergy status to penicillin: Secondary | ICD-10-CM

## 2012-08-10 DIAGNOSIS — I739 Peripheral vascular disease, unspecified: Secondary | ICD-10-CM | POA: Diagnosis present

## 2012-08-10 DIAGNOSIS — K611 Rectal abscess: Secondary | ICD-10-CM

## 2012-08-10 DIAGNOSIS — Z9861 Coronary angioplasty status: Secondary | ICD-10-CM

## 2012-08-10 DIAGNOSIS — Z7902 Long term (current) use of antithrombotics/antiplatelets: Secondary | ICD-10-CM

## 2012-08-10 DIAGNOSIS — N400 Enlarged prostate without lower urinary tract symptoms: Secondary | ICD-10-CM | POA: Diagnosis present

## 2012-08-10 HISTORY — DX: Essential (primary) hypertension: I10

## 2012-08-10 LAB — CBC WITH DIFFERENTIAL/PLATELET
Basophils Absolute: 0 10*3/uL (ref 0.0–0.1)
HCT: 35.6 % — ABNORMAL LOW (ref 39.0–52.0)
Lymphocytes Relative: 13 % (ref 12–46)
Monocytes Absolute: 1.4 10*3/uL — ABNORMAL HIGH (ref 0.1–1.0)
Neutro Abs: 13 10*3/uL — ABNORMAL HIGH (ref 1.7–7.7)
RBC: 4.16 MIL/uL — ABNORMAL LOW (ref 4.22–5.81)
RDW: 14.8 % (ref 11.5–15.5)
WBC: 16.9 10*3/uL — ABNORMAL HIGH (ref 4.0–10.5)

## 2012-08-10 LAB — URINE MICROSCOPIC-ADD ON

## 2012-08-10 LAB — BASIC METABOLIC PANEL
CO2: 29 mEq/L (ref 19–32)
Chloride: 101 mEq/L (ref 96–112)
Creatinine, Ser: 1.18 mg/dL (ref 0.50–1.35)
Sodium: 140 mEq/L (ref 135–145)

## 2012-08-10 LAB — URINALYSIS, ROUTINE W REFLEX MICROSCOPIC
Glucose, UA: NEGATIVE mg/dL
pH: 6 (ref 5.0–8.0)

## 2012-08-10 MED ORDER — HYDROMORPHONE HCL PF 1 MG/ML IJ SOLN
1.0000 mg | INTRAMUSCULAR | Status: DC | PRN
  Administered 2012-08-11 – 2012-08-14 (×11): 1 mg via INTRAVENOUS
  Filled 2012-08-10 (×11): qty 1

## 2012-08-10 MED ORDER — MORPHINE SULFATE 4 MG/ML IJ SOLN
6.0000 mg | Freq: Once | INTRAMUSCULAR | Status: AC
  Administered 2012-08-10: 6 mg via INTRAVENOUS
  Filled 2012-08-10: qty 2

## 2012-08-10 MED ORDER — LISINOPRIL 10 MG PO TABS
10.0000 mg | ORAL_TABLET | Freq: Every day | ORAL | Status: DC
  Administered 2012-08-12 – 2012-08-14 (×3): 10 mg via ORAL
  Filled 2012-08-10 (×5): qty 1

## 2012-08-10 MED ORDER — KCL IN DEXTROSE-NACL 20-5-0.9 MEQ/L-%-% IV SOLN
INTRAVENOUS | Status: DC
  Administered 2012-08-10 – 2012-08-12 (×4): via INTRAVENOUS
  Filled 2012-08-10 (×8): qty 1000

## 2012-08-10 MED ORDER — IOHEXOL 300 MG/ML  SOLN
20.0000 mL | INTRAMUSCULAR | Status: AC
  Administered 2012-08-10 (×2): 20 mL via ORAL

## 2012-08-10 MED ORDER — CARVEDILOL 3.125 MG PO TABS
3.1250 mg | ORAL_TABLET | Freq: Two times a day (BID) | ORAL | Status: DC
  Administered 2012-08-11 – 2012-08-14 (×7): 3.125 mg via ORAL
  Filled 2012-08-10 (×11): qty 1

## 2012-08-10 MED ORDER — CIPROFLOXACIN IN D5W 400 MG/200ML IV SOLN
400.0000 mg | Freq: Two times a day (BID) | INTRAVENOUS | Status: DC
  Administered 2012-08-10 – 2012-08-12 (×5): 400 mg via INTRAVENOUS
  Filled 2012-08-10 (×7): qty 200

## 2012-08-10 MED ORDER — CLINDAMYCIN PHOSPHATE 900 MG/50ML IV SOLN
900.0000 mg | Freq: Once | INTRAVENOUS | Status: AC
  Administered 2012-08-10: 900 mg via INTRAVENOUS
  Filled 2012-08-10: qty 50

## 2012-08-10 MED ORDER — METRONIDAZOLE IN NACL 5-0.79 MG/ML-% IV SOLN
500.0000 mg | Freq: Three times a day (TID) | INTRAVENOUS | Status: DC
  Administered 2012-08-11 – 2012-08-13 (×8): 500 mg via INTRAVENOUS
  Filled 2012-08-10 (×10): qty 100

## 2012-08-10 MED ORDER — ONDANSETRON HCL 4 MG/2ML IJ SOLN: 4.0000 mg | Freq: Four times a day (QID) | INTRAMUSCULAR | Status: DC | PRN

## 2012-08-10 MED ORDER — IOHEXOL 300 MG/ML  SOLN
100.0000 mL | Freq: Once | INTRAMUSCULAR | Status: AC | PRN
  Administered 2012-08-10: 100 mL via INTRAVENOUS

## 2012-08-10 NOTE — ED Notes (Signed)
Onset 3 days ago left flank pain with blood in his urine today.  Pain now 8/10 achy throbbing. Patient states abscess on buttocks 4 days ago worsening overtime pain 8/10 achy sharp when sitting.

## 2012-08-10 NOTE — H&P (Signed)
Cameron Petty is an 56 y.o. male.   Chief Complaint: Right buttock pain. HPI: Asked to see patient at the request of Dr Cameron Petty due to 1 day history of right buttock pain itching and burning.  Had some left flank pain but that has resolved.  No drainage.   Pain is severe especially when pushed.  Past Medical History  Diagnosis Date  . Hypertension     History reviewed. No pertinent past surgical history.  No family history on file. Social History:  reports that he has quit smoking. He does not have any smokeless tobacco history on file. He reports that he does not drink alcohol or use illicit drugs.  Allergies:  Allergies  Allergen Reactions  . Penicillins Rash     (Not in a hospital admission)  Results for orders placed during the hospital encounter of 08/10/12 (from the past 48 hour(s))  CBC WITH DIFFERENTIAL     Status: Abnormal   Collection Time   08/10/12  2:56 PM      Component Value Range Comment   WBC 16.9 (*) 4.0 - 10.5 K/uL    RBC 4.16 (*) 4.22 - 5.81 MIL/uL    Hemoglobin 12.1 (*) 13.0 - 17.0 g/dL    HCT 09.8 (*) 11.9 - 52.0 %    MCV 85.6  78.0 - 100.0 fL    MCH 29.1  26.0 - 34.0 pg    MCHC 34.0  30.0 - 36.0 g/dL    RDW 14.7  82.9 - 56.2 %    Platelets 352  150 - 400 K/uL    Neutrophils Relative 77  43 - 77 %    Neutro Abs 13.0 (*) 1.7 - 7.7 K/uL    Lymphocytes Relative 13  12 - 46 %    Lymphs Abs 2.1  0.7 - 4.0 K/uL    Monocytes Relative 8  3 - 12 %    Monocytes Absolute 1.4 (*) 0.1 - 1.0 K/uL    Eosinophils Relative 3  0 - 5 %    Eosinophils Absolute 0.5  0.0 - 0.7 K/uL    Basophils Relative 0  0 - 1 %    Basophils Absolute 0.0  0.0 - 0.1 K/uL   BASIC METABOLIC PANEL     Status: Abnormal   Collection Time   08/10/12  2:56 PM      Component Value Range Comment   Sodium 140  135 - 145 mEq/L    Potassium 3.2 (*) 3.5 - 5.1 mEq/L    Chloride 101  96 - 112 mEq/L    CO2 29  19 - 32 mEq/L    Glucose, Bld 107 (*) 70 - 99 mg/dL    BUN 7  6 - 23 mg/dL    Creatinine, Ser 1.30  0.50 - 1.35 mg/dL    Calcium 9.3  8.4 - 86.5 mg/dL    GFR calc non Af Amer 67 (*) >90 mL/min    GFR calc Af Amer 78 (*) >90 mL/min   URINALYSIS, ROUTINE W REFLEX MICROSCOPIC     Status: Abnormal   Collection Time   08/10/12  4:44 PM      Component Value Range Comment   Color, Urine YELLOW  YELLOW    APPearance CLEAR  CLEAR    Specific Gravity, Urine 1.014  1.005 - 1.030    pH 6.0  5.0 - 8.0    Glucose, UA NEGATIVE  NEGATIVE mg/dL    Hgb urine dipstick SMALL (*) NEGATIVE  Bilirubin Urine NEGATIVE  NEGATIVE    Ketones, ur NEGATIVE  NEGATIVE mg/dL    Protein, ur NEGATIVE  NEGATIVE mg/dL    Urobilinogen, UA 1.0  0.0 - 1.0 mg/dL    Nitrite NEGATIVE  NEGATIVE    Leukocytes, UA SMALL (*) NEGATIVE   URINE MICROSCOPIC-ADD ON     Status: Normal   Collection Time   08/10/12  4:44 PM      Component Value Range Comment   Squamous Epithelial / LPF RARE  RARE    WBC, UA 3-6  <3 WBC/hpf    RBC / HPF 0-2  <3 RBC/hpf    Ct Abdomen Pelvis W Contrast  08/10/2012  *RADIOLOGY REPORT*  Clinical Data: Left flank pain beginning 3 days ago.  Hematuria.  CT ABDOMEN AND PELVIS WITH CONTRAST  Technique:  Multidetector CT imaging of the abdomen and pelvis was performed following the standard protocol during bolus administration of intravenous contrast.  Contrast: OMNIPAQUE IOHEXOL 300 MG/ML  SOLN  Comparison: CT abdomen and pelvis 11/21/2007.  Findings: There is some dependent atelectasis in the left lung base.  No pleural or pericardial effusion.  A 0.6 cm stone is seen in the mid pole of the left kidney.  Low attenuating lesions are present in both kidneys.  The largest is on the right measuring 3.2 cm in diameter.  Some of these are too small to definitively characterize but are most consistent with cysts.  The kidneys otherwise appear normal.  There is no hydronephrosis.  No ureteral stones are identified.  The urinary bladder and seminal vesicles are unremarkable.  The prostate gland  is mildly prominent.  The patient is status post cholecystectomy.  Three to four punctate calcifications in the liver are unchanged.  The liver otherwise appears normal.  The spleen, adrenal glands, biliary tree and pancreas appear normal.  There is a right perirectal fluid collection measuring 2.4 x 1.7 cm the axial plane by 4.3 cm cranial- caudal.  There is infiltration of the adjacent subcutaneous fat. No other fluid collection is seen.  The stomach, small and large bowel and appendix appear normal. There is no focal bony abnormality.  IMPRESSION:  1.  Findings consistent with a right perirectal abscess. 2.  Nonobstructing stone upper pole left kidney. 3.  Low attenuating lesions both kidneys most consistent with cysts.  Original Report Authenticated By: Cameron Petty. Cameron Petty, M.D.    Review of Systems  Constitutional: Negative for fever and chills.  HENT: Negative.   Eyes: Negative.   Respiratory: Negative.   Gastrointestinal: Negative.   Genitourinary: Positive for flank pain.  Musculoskeletal: Negative.   Skin: Positive for itching.  Neurological: Negative.   Endo/Heme/Allergies: Negative.   Psychiatric/Behavioral: Negative.     Blood pressure 133/77, pulse 95, temperature 97.5 F (36.4 C), temperature source Oral, resp. rate 18, SpO2 98.00%. Physical Exam  Constitutional: He is oriented to person, place, and time. He appears well-developed and well-nourished.  HENT:  Head: Normocephalic.  Eyes: EOM are normal. Pupils are equal, round, and reactive to light.  Neck: Normal range of motion. Neck supple.  Cardiovascular: Normal rate and regular rhythm.   Respiratory: Effort normal and breath sounds normal.  GI: Soft. Bowel sounds are normal.  Genitourinary:     Neurological: He is alert and oriented to person, place, and time.  Skin: Skin is warm and dry.  Psychiatric: He has a normal mood and affect. His behavior is normal. Judgment and thought content normal.  Assessment/Plan Large perirectal abscess Admit for IV ABX and surgical drainage in AM.  Cameron Petty A. 08/10/2012, 8:48 PM

## 2012-08-10 NOTE — ED Notes (Signed)
Called CT advised pt completed contrast.

## 2012-08-10 NOTE — ED Provider Notes (Signed)
History     CSN: [id number]  Arrival date & time 08/10/12  1449   First MD Initiated Contact with Patient 08/10/12 1615      Chief Complaint  Patient presents with  . Flank Pain    (Consider location/radiation/quality/duration/timing/severity/associated sxs/prior treatment) HPI Patient since emergency department with left flank lower back pain and pain around his rectal area. The patient states that this started 4 days ago and has only worsened. The patient states that he has not take any treatment prior to arrival for his symptoms. Patient states that he noted some possible blood in his urine today. The patient states that he has not had any fevers chest pain shortness of breath nausea vomiting weakness or fever.  Patient states, that he's noted some discharge from his buttocks area.  Patient denies any previous history of this type of infection. Past Medical History  Diagnosis Date  . Hypertension     History reviewed. No pertinent past surgical history.  No family history on file.  History  Substance Use Topics  . Smoking status: Former Games developer  . Smokeless tobacco: Not on file  . Alcohol Use: No      Review of Systems All other systems negative except as documented in the HPI. All pertinent positives and negatives as reviewed in the HPI.  Allergies  Penicillins  Home Medications   Current Outpatient Rx  Name Route Sig Dispense Refill  . ASPIRIN EC 81 MG PO TBEC Oral Take 81 mg by mouth daily.    . ATORVASTATIN CALCIUM 40 MG PO TABS Oral Take 40 mg by mouth daily.    Marland Kitchen CARVEDILOL 3.125 MG PO TABS Oral Take 3.125 mg by mouth 2 (two) times daily with a meal.    . CLOPIDOGREL BISULFATE 75 MG PO TABS Oral Take 75 mg by mouth daily.    Marland Kitchen LISINOPRIL 10 MG PO TABS Oral Take 10 mg by mouth daily.      BP 133/77  Pulse 95  Temp 97.5 F (36.4 C) (Oral)  Resp 18  SpO2 98%  Physical Exam  Nursing note and vitals reviewed. Constitutional: He appears well-developed  and well-nourished.  HENT:  Head: Normocephalic and atraumatic.  Cardiovascular: Normal rate, regular rhythm and normal heart sounds.   Pulmonary/Chest: Effort normal and breath sounds normal.  Abdominal: Soft. Bowel sounds are normal. He exhibits no distension. There is no tenderness. There is no rebound and no guarding.  Skin:       ED Course  Procedures (including critical care time)  Labs Reviewed  CBC WITH DIFFERENTIAL - Abnormal; Notable for the following:    WBC 16.9 (*)     RBC 4.16 (*)     Hemoglobin 12.1 (*)     HCT 35.6 (*)     Neutro Abs 13.0 (*)     Monocytes Absolute 1.4 (*)     All other components within normal limits  BASIC METABOLIC PANEL - Abnormal; Notable for the following:    Potassium 3.2 (*)     Glucose, Bld 107 (*)     GFR calc non Af Amer 67 (*)     GFR calc Af Amer 78 (*)     All other components within normal limits  URINALYSIS, ROUTINE W REFLEX MICROSCOPIC - Abnormal; Notable for the following:    Hgb urine dipstick SMALL (*)     Leukocytes, UA SMALL (*)     All other components within normal limits  URINE MICROSCOPIC-ADD ON   Ct  Abdomen Pelvis W Contrast  08/10/2012  *RADIOLOGY REPORT*  Clinical Data: Left flank pain beginning 3 days ago.  Hematuria.  CT ABDOMEN AND PELVIS WITH CONTRAST  Technique:  Multidetector CT imaging of the abdomen and pelvis was performed following the standard protocol during bolus administration of intravenous contrast.  Contrast: OMNIPAQUE IOHEXOL 300 MG/ML  SOLN  Comparison: CT abdomen and pelvis 11/21/2007.  Findings: There is some dependent atelectasis in the left lung base.  No pleural or pericardial effusion.  A 0.6 cm stone is seen in the mid pole of the left kidney.  Low attenuating lesions are present in both kidneys.  The largest is on the right measuring 3.2 cm in diameter.  Some of these are too small to definitively characterize but are most consistent with cysts.  The kidneys otherwise appear normal.   There is no hydronephrosis.  No ureteral stones are identified.  The urinary bladder and seminal vesicles are unremarkable.  The prostate gland is mildly prominent.  The patient is status post cholecystectomy.  Three to four punctate calcifications in the liver are unchanged.  The liver otherwise appears normal.  The spleen, adrenal glands, biliary tree and pancreas appear normal.  There is a right perirectal fluid collection measuring 2.4 x 1.7 cm the axial plane by 4.3 cm cranial- caudal.  There is infiltration of the adjacent subcutaneous fat. No other fluid collection is seen.  The stomach, small and large bowel and appendix appear normal. There is no focal bony abnormality.  IMPRESSION:  1.  Findings consistent with a right perirectal abscess. 2.  Nonobstructing stone upper pole left kidney. 3.  Low attenuating lesions both kidneys most consistent with cysts.  Original Report Authenticated By: Bernadene Bell. Maricela Curet, M.D.    Patient be given clindamycin IV, and the [facility] on call, was consulted.  He'll be down to see the patient for evaluation of this abscess  MDM  MDM Reviewed: vitals and nursing note Interpretation: labs and CT scan Consults: general surgery            Carlyle Dolly, PA-C 08/10/12 2103

## 2012-08-10 NOTE — ED Notes (Signed)
MD at bedside. 

## 2012-08-10 NOTE — ED Provider Notes (Signed)
Medical screening examination/treatment/procedure(s) were conducted as a shared visit with non-physician practitioner(s) and myself.  I personally evaluated the patient during the encounter  Cameron Petty is a 56 y.o. male hx of HTN here with perirectal abscess. Noticed the abscess 4 days ago and had purulent drainage. He had subjective fevers at home. He also had some L flank pain but no dysuria or hematuria.   Exam significant for a large R perirectal abscess and nontender abdomen. Labs showed WBC 17, CMP nl, UA showed small leuks.  CT ab/pel showed R perirectal abscess.   I gave IV clinda for the abscess. I called the oncall surgeon, Dr. Luisa Hart, who examined the patient and admitted him to his service.     Richardean Canal, MD 08/10/12 2259

## 2012-08-11 ENCOUNTER — Encounter (HOSPITAL_COMMUNITY): Admission: EM | Disposition: A | Discharge status: Home / Self Care | Payer: Self-pay | Source: Home / Self Care

## 2012-08-11 ENCOUNTER — Encounter (HOSPITAL_COMMUNITY): Payer: Self-pay | Admitting: *Deleted

## 2012-08-11 ENCOUNTER — Inpatient Hospital Stay (HOSPITAL_COMMUNITY): Payer: Medicaid Other | Admitting: *Deleted

## 2012-08-11 HISTORY — PX: INCISION AND DRAINAGE PERIRECTAL ABSCESS: SHX1804

## 2012-08-11 LAB — SURGICAL PCR SCREEN: MRSA, PCR: NEGATIVE

## 2012-08-11 SURGERY — INCISION AND DRAINAGE, ABSCESS, PERIRECTAL
Anesthesia: General | Site: Perineum | Wound class: Dirty or Infected

## 2012-08-11 MED ORDER — PROMETHAZINE HCL 25 MG/ML IJ SOLN: 6.2500 mg | INTRAMUSCULAR | Status: DC | PRN

## 2012-08-11 MED ORDER — SUCCINYLCHOLINE CHLORIDE 20 MG/ML IJ SOLN
INTRAMUSCULAR | Status: DC | PRN
  Administered 2012-08-11: 100 mg via INTRAVENOUS

## 2012-08-11 MED ORDER — LACTATED RINGERS IV SOLN
INTRAVENOUS | Status: DC | PRN
  Administered 2012-08-11 (×3): via INTRAVENOUS

## 2012-08-11 MED ORDER — HYDROMORPHONE HCL PF 1 MG/ML IJ SOLN
INTRAMUSCULAR | Status: AC
  Filled 2012-08-11: qty 1

## 2012-08-11 MED ORDER — BUPIVACAINE-EPINEPHRINE PF 0.25-1:200000 % IJ SOLN
INTRAMUSCULAR | Status: AC
  Filled 2012-08-11: qty 30

## 2012-08-11 MED ORDER — MEPERIDINE HCL 25 MG/ML IJ SOLN: 6.2500 mg | INTRAMUSCULAR | Status: DC | PRN

## 2012-08-11 MED ORDER — 0.9 % SODIUM CHLORIDE (POUR BTL) OPTIME
TOPICAL | Status: DC | PRN
  Administered 2012-08-11: 1000 mL

## 2012-08-11 MED ORDER — BUPIVACAINE-EPINEPHRINE PF 0.25-1:200000 % IJ SOLN
INTRAMUSCULAR | Status: DC | PRN
  Administered 2012-08-11: 30 mL

## 2012-08-11 MED ORDER — PROPOFOL 10 MG/ML IV EMUL
INTRAVENOUS | Status: DC | PRN
  Administered 2012-08-11: 200 mg via INTRAVENOUS

## 2012-08-11 MED ORDER — LIDOCAINE HCL (CARDIAC) 20 MG/ML IV SOLN
INTRAVENOUS | Status: DC | PRN
  Administered 2012-08-11: 100 mg via INTRAVENOUS

## 2012-08-11 MED ORDER — HYDROMORPHONE HCL PF 1 MG/ML IJ SOLN
0.2500 mg | INTRAMUSCULAR | Status: DC | PRN
  Administered 2012-08-11 (×2): 0.5 mg via INTRAVENOUS

## 2012-08-11 MED ORDER — MIDAZOLAM HCL 5 MG/5ML IJ SOLN
INTRAMUSCULAR | Status: DC | PRN
  Administered 2012-08-11 (×2): 1 mg via INTRAVENOUS

## 2012-08-11 MED ORDER — FENTANYL CITRATE 0.05 MG/ML IJ SOLN
INTRAMUSCULAR | Status: DC | PRN
  Administered 2012-08-11 (×3): 50 ug via INTRAVENOUS

## 2012-08-11 SURGICAL SUPPLY — 38 items
BLADE SURG 15 STRL LF DISP TIS (BLADE) ×1 IMPLANT
BLADE SURG 15 STRL SS (BLADE) ×2
CANISTER SUCTION 2500CC (MISCELLANEOUS) ×2 IMPLANT
CLEANER TIP ELECTROSURG 2X2 (MISCELLANEOUS) ×1 IMPLANT
CLOTH BEACON ORANGE TIMEOUT ST (SAFETY) ×2 IMPLANT
COVER SURGICAL LIGHT HANDLE (MISCELLANEOUS) ×2 IMPLANT
DRAPE UTILITY 15X26 W/TAPE STR (DRAPE) ×4 IMPLANT
DRSG PAD ABDOMINAL 8X10 ST (GAUZE/BANDAGES/DRESSINGS) ×2 IMPLANT
ELECT REM PT RETURN 9FT ADLT (ELECTROSURGICAL) ×2
ELECTRODE REM PT RTRN 9FT ADLT (ELECTROSURGICAL) IMPLANT
GAUZE PACKING IODOFORM 1 (PACKING) IMPLANT
GAUZE SPONGE 4X4 16PLY XRAY LF (GAUZE/BANDAGES/DRESSINGS) ×2 IMPLANT
GLOVE BIO SURGEON STRL SZ7.5 (GLOVE) ×2 IMPLANT
GLOVE BIOGEL PI IND STRL 7.0 (GLOVE) IMPLANT
GLOVE BIOGEL PI INDICATOR 7.0 (GLOVE) ×3
GLOVE SURG SS PI 7.0 STRL IVOR (GLOVE) ×1 IMPLANT
GOWN STRL NON-REIN LRG LVL3 (GOWN DISPOSABLE) ×5 IMPLANT
KIT BASIN OR (CUSTOM PROCEDURE TRAY) ×2 IMPLANT
KIT ROOM TURNOVER OR (KITS) ×2 IMPLANT
LOOP VESSEL MAXI BLUE (MISCELLANEOUS) ×1 IMPLANT
NDL HYPO 25GX1X1/2 BEV (NEEDLE) IMPLANT
NEEDLE HYPO 25GX1X1/2 BEV (NEEDLE) ×2 IMPLANT
NS IRRIG 1000ML POUR BTL (IV SOLUTION) ×2 IMPLANT
PACK LITHOTOMY IV (CUSTOM PROCEDURE TRAY) ×2 IMPLANT
PAD ARMBOARD 7.5X6 YLW CONV (MISCELLANEOUS) ×4 IMPLANT
PENCIL BUTTON HOLSTER BLD 10FT (ELECTRODE) ×1 IMPLANT
SPONGE GAUZE 4X4 12PLY (GAUZE/BANDAGES/DRESSINGS) ×2 IMPLANT
SUT SILK 0 TIES 10X30 (SUTURE) ×1 IMPLANT
SWAB COLLECTION DEVICE MRSA (MISCELLANEOUS) ×2 IMPLANT
SYR CONTROL 10ML LL (SYRINGE) ×1 IMPLANT
TAPE CLOTH SURG 6X10 WHT LF (GAUZE/BANDAGES/DRESSINGS) ×1 IMPLANT
TOWEL OR 17X24 6PK STRL BLUE (TOWEL DISPOSABLE) ×2 IMPLANT
TOWEL OR 17X26 10 PK STRL BLUE (TOWEL DISPOSABLE) ×2 IMPLANT
TUBE ANAEROBIC SPECIMEN COL (MISCELLANEOUS) ×2 IMPLANT
TUBE CONNECTING 12X1/4 (SUCTIONS) ×2 IMPLANT
UNDERPAD 30X30 INCONTINENT (UNDERPADS AND DIAPERS) ×2 IMPLANT
WATER STERILE IRR 1000ML POUR (IV SOLUTION) ×1 IMPLANT
YANKAUER SUCT BULB TIP NO VENT (SUCTIONS) ×2 IMPLANT

## 2012-08-11 NOTE — Progress Notes (Signed)
Subjective: Still very tender waiting on surgery, lying on left side. On plavix and asprin for his  Prior to Admission medications   Medication Sig Start Date End Date Taking? Authorizing Provider  aspirin EC 81 MG tablet Take 81 mg by mouth daily.   Yes Historical Provider, MD  atorvastatin (LIPITOR) 40 MG tablet Take 40 mg by mouth daily.   Yes Historical Provider, MD  carvedilol (COREG) 3.125 MG tablet Take 3.125 mg by mouth 2 (two) times daily with a meal.   Yes Historical Provider, MD  clopidogrel (PLAVIX) 75 MG tablet Take 75 mg by mouth daily.   Yes Historical Provider, MD  lisinopril (PRINIVIL,ZESTRIL) 10 MG tablet Take 10 mg by mouth daily.   Yes Historical Provider, MD   Objective: Vital signs in last 24 hours: Temp:  [97.5 F (36.4 C)-98.8 F (37.1 C)] 98.8 F (37.1 C) (08/16 0600) Pulse Rate:  [70-95] 72  (08/16 0851) Resp:  [14-18] 14  (08/16 0600) BP: (110-141)/(50-83) 110/66 mmHg (08/16 0851) SpO2:  [96 %-100 %] 100 % (08/16 0600) Weight:  [217 lb 13 oz (98.8 kg)] 217 lb 13 oz (98.8 kg) (08/15 2209)   Afebrile, VSS, K+ 3.2 WBC up to 16.9 on admit.  Intake/Output from previous day: 08/15 0701 - 08/16 0700 In: 455 [I.V.:455] Out: -  Intake/Output this shift:    General appearance: alert, cooperative and no distress Rectal area:  Tender palpable fluid containing area R perirectal area.  Lab Results:   [facility] 08/10/12 1456  WBC 16.9*  HGB 12.1*  HCT 35.6*  PLT 352    BMET  Basename 08/10/12 1456  NA 140  K 3.2*  CL 101  CO2 29  GLUCOSE 107*  BUN 7  CREATININE 1.18  CALCIUM 9.3   PT/INR No results found for this basename: LABPROT:2,INR:2 in the last 72 hours  No results found for this basename: AST:5,ALT:5,ALKPHOS:5,BILITOT:5,PROT:5,ALBUMIN:5 in the last 168 hours   Lipase     Component Value Date/Time   LIPASE 26 05/20/2010 0615     Studies/Results: Ct Abdomen Pelvis W Contrast  08/10/2012  *RADIOLOGY REPORT*  Clinical Data: Left  flank pain beginning 3 days ago.  Hematuria.  CT ABDOMEN AND PELVIS WITH CONTRAST  Technique:  Multidetector CT imaging of the abdomen and pelvis was performed following the standard protocol during bolus administration of intravenous contrast.  Contrast: OMNIPAQUE IOHEXOL 300 MG/ML  SOLN  Comparison: CT abdomen and pelvis 11/21/2007.  Findings: There is some dependent atelectasis in the left lung base.  No pleural or pericardial effusion.  A 0.6 cm stone is seen in the mid pole of the left kidney.  Low attenuating lesions are present in both kidneys.  The largest is on the right measuring 3.2 cm in diameter.  Some of these are too small to definitively characterize but are most consistent with cysts.  The kidneys otherwise appear normal.  There is no hydronephrosis.  No ureteral stones are identified.  The urinary bladder and seminal vesicles are unremarkable.  The prostate gland is mildly prominent.  The patient is status post cholecystectomy.  Three to four punctate calcifications in the liver are unchanged.  The liver otherwise appears normal.  The spleen, adrenal glands, biliary tree and pancreas appear normal.  There is a right perirectal fluid collection measuring 2.4 x 1.7 cm the axial plane by 4.3 cm cranial- caudal.  There is infiltration of the adjacent subcutaneous fat. No other fluid collection is seen.  The stomach, small and  large bowel and appendix appear normal. There is no focal bony abnormality.  IMPRESSION:  1.  Findings consistent with a right perirectal abscess. 2.  Nonobstructing stone upper pole left kidney. 3.  Low attenuating lesions both kidneys most consistent with cysts.  Original Report Authenticated By: Bernadene Bell. Maricela Curet, M.D.    Medications:    . carvedilol  3.125 mg Oral BID WC  . ciprofloxacin  400 mg Intravenous Q12H  . clindamycin (CLEOCIN) IV  900 mg Intravenous Once  . iohexol  20 mL Oral Q1 Hr x 2  . lisinopril  10 mg Oral Daily  . metronidazole  500 mg  Intravenous Q8H  .  morphine injection  6 mg Intravenous Once    Assessment/Plan Right Perirectal Abscess Hx CAD/Stent to Circ 2007.  Cath 04/2010 showed 70%LAD, 50-60% mid circ stenosis, 50%RCA, Bilat iliac dz, left greater than right. Hypertension Claudication ED BPH Tobacco use S/p cholecystectomy 04/2010 Dr. Daphine Deutscher   Plan:  For OR later today, plavix and asprin held.  He is followed by Dr. Sharyn Lull    LOS: 1 day    Sherrie George 08/11/2012

## 2012-08-11 NOTE — Preoperative (Signed)
Beta Blockers   Reason not to administer Beta Blockers:Not Applicable 

## 2012-08-11 NOTE — Anesthesia Postprocedure Evaluation (Signed)
  Anesthesia Post-op Note  Patient: Cameron Petty  Procedure(s) Performed: Procedure(s) (LRB): IRRIGATION AND DEBRIDEMENT PERIRECTAL ABSCESS (N/A) PLACEMENT OF SETON (N/A)  Patient Location: PACU  Anesthesia Type: General  Level of Consciousness: awake  Airway and Oxygen Therapy: Patient Spontanous Breathing  Post-op Pain: mild  Post-op Assessment: Post-op Vital signs reviewed  Post-op Vital Signs: stable  Complications: No apparent anesthesia complications

## 2012-08-11 NOTE — Anesthesia Preprocedure Evaluation (Signed)
Anesthesia Evaluation  Patient identified by MRN, date of birth, ID band Patient awake    Reviewed: Allergy & Precautions, H&P , NPO status , Patient's Chart, lab work & pertinent test results  History of Anesthesia Complications Negative for: history of anesthetic complications  Airway Mallampati: I  Neck ROM: Full    Dental   Pulmonary neg pulmonary ROS,  breath sounds clear to auscultation        Cardiovascular hypertension, + Past MI and + Peripheral Vascular Disease Rhythm:Regular Rate:Normal     Neuro/Psych    GI/Hepatic Neg liver ROS,   Endo/Other  negative endocrine ROS  Renal/GU negative Renal ROS     Musculoskeletal   Abdominal (+) + obese,   Peds  Hematology   Anesthesia Other Findings   Reproductive/Obstetrics                           Anesthesia Physical Anesthesia Plan  ASA: III and Emergent  Anesthesia Plan: General   Post-op Pain Management:    Induction: Intravenous  Airway Management Planned: Oral ETT and LMA  Additional Equipment:   Intra-op Plan:   Post-operative Plan: Extubation in OR  Informed Consent: I have reviewed the patients History and Physical, chart, labs and discussed the procedure including the risks, benefits and alternatives for the proposed anesthesia with the patient or authorized representative who has indicated his/her understanding and acceptance.     Plan Discussed with: CRNA and Surgeon  Anesthesia Plan Comments:         Anesthesia Quick Evaluation
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
## 2012-08-11 NOTE — Transfer of Care (Signed)
Immediate Anesthesia Transfer of Care Note  Patient: Cameron Petty  Procedure(s) Performed: Procedure(s) (LRB): IRRIGATION AND DEBRIDEMENT PERIRECTAL ABSCESS (N/A) PLACEMENT OF SETON (N/A)  Patient Location: PACU  Anesthesia Type: General  Level of Consciousness: awake and alert   Airway & Oxygen Therapy: Patient Spontanous Breathing and Patient connected to nasal cannula oxygen  Post-op Assessment: Report given to PACU RN and Post -op Vital signs reviewed and stable  Post vital signs: Reviewed and stable  Complications: No apparent anesthesia complications

## 2012-08-11 NOTE — Progress Notes (Signed)
Subjective: Complains of pain at rectum  Objective: Vital signs in last 24 hours: Temp:  [97.5 F (36.4 C)-98.8 F (37.1 C)] 98.8 F (37.1 C) (08/16 0600) Pulse Rate:  [70-95] 72  (08/16 0851) Resp:  [14-18] 14  (08/16 0600) BP: (110-141)/(50-83) 110/66 mmHg (08/16 0851) SpO2:  [96 %-100 %] 100 % (08/16 0600) Weight:  [217 lb 13 oz (98.8 kg)] 217 lb 13 oz (98.8 kg) (08/15 2209)    Intake/Output from previous day: 08/15 0701 - 08/16 0700 In: 455 [I.V.:455] Out: -  Intake/Output this shift:    Male genitalia: perirectal abscess  Lab Results:   Basename 08/10/12 1456  WBC 16.9*  HGB 12.1*  HCT 35.6*  PLT 352   BMET  Basename 08/10/12 1456  NA 140  K 3.2*  CL 101  CO2 29  GLUCOSE 107*  BUN 7  CREATININE 1.18  CALCIUM 9.3   PT/INR No results found for this basename: LABPROT:2,INR:2 in the last 72 hours ABG No results found for this basename: PHART:2,PCO2:2,PO2:2,HCO3:2 in the last 72 hours  Studies/Results: Ct Abdomen Pelvis W Contrast  08/10/2012  *RADIOLOGY REPORT*  Clinical Data: Left flank pain beginning 3 days ago.  Hematuria.  CT ABDOMEN AND PELVIS WITH CONTRAST  Technique:  Multidetector CT imaging of the abdomen and pelvis was performed following the standard protocol during bolus administration of intravenous contrast.  Contrast: OMNIPAQUE IOHEXOL 300 MG/ML  SOLN  Comparison: CT abdomen and pelvis 11/21/2007.  Findings: There is some dependent atelectasis in the left lung base.  No pleural or pericardial effusion.  A 0.6 cm stone is seen in the mid pole of the left kidney.  Low attenuating lesions are present in both kidneys.  The largest is on the right measuring 3.2 cm in diameter.  Some of these are too small to definitively characterize but are most consistent with cysts.  The kidneys otherwise appear normal.  There is no hydronephrosis.  No ureteral stones are identified.  The urinary bladder and seminal vesicles are unremarkable.  The prostate  gland is mildly prominent.  The patient is status post cholecystectomy.  Three to four punctate calcifications in the liver are unchanged.  The liver otherwise appears normal.  The spleen, adrenal glands, biliary tree and pancreas appear normal.  There is a right perirectal fluid collection measuring 2.4 x 1.7 cm the axial plane by 4.3 cm cranial- caudal.  There is infiltration of the adjacent subcutaneous fat. No other fluid collection is seen.  The stomach, small and large bowel and appendix appear normal. There is no focal bony abnormality.  IMPRESSION:  1.  Findings consistent with a right perirectal abscess. 2.  Nonobstructing stone upper pole left kidney. 3.  Low attenuating lesions both kidneys most consistent with cysts.  Original Report Authenticated By: Bernadene Bell. Maricela Curet, M.D.    Anti-infectives: Anti-infectives     Start     Dose/Rate Route Frequency Ordered Stop   08/10/12 2315   metroNIDAZOLE (FLAGYL) IVPB 500 mg        500 mg 100 mL/hr over 60 Minutes Intravenous Every 8 hours 08/10/12 2310     08/10/12 2315   ciprofloxacin (CIPRO) IVPB 400 mg        400 mg 200 mL/hr over 60 Minutes Intravenous Every 12 hours 08/10/12 2310     08/10/12 2000   clindamycin (CLEOCIN) IVPB 900 mg        900 mg 100 mL/hr over 30 Minutes Intravenous  Once 08/10/12 1948 08/10/12  2038          Assessment/Plan: s/p Procedure(s) (LRB): IRRIGATION AND DEBRIDEMENT PERIRECTAL ABSCESS (N/A) the risks and benefits of surgery were discussed with the patient in detail including some of the technical aspects. He understands and wishes to proceed.  LOS: 1 day    TOTH III,PAUL S 08/11/2012

## 2012-08-12 LAB — BASIC METABOLIC PANEL
CO2: 28 mEq/L (ref 19–32)
Chloride: 104 mEq/L (ref 96–112)
Glucose, Bld: 112 mg/dL — ABNORMAL HIGH (ref 70–99)
Potassium: 3.9 mEq/L (ref 3.5–5.1)
Sodium: 140 mEq/L (ref 135–145)

## 2012-08-12 LAB — CBC
Hemoglobin: 11.7 g/dL — ABNORMAL LOW (ref 13.0–17.0)
RBC: 4.17 MIL/uL — ABNORMAL LOW (ref 4.22–5.81)
WBC: 10.1 10*3/uL (ref 4.0–10.5)

## 2012-08-12 NOTE — Progress Notes (Signed)
1 Day Post-Op  Subjective: Resting quietly, still quite a bit of tenderness from surgery per patient. Otherwise no c/o  Objective: Vital signs in last 24 hours: Temp:  [98.3 F (36.8 C)-99.4 F (37.4 C)] 98.6 F (37 C) (08/17 1610) Pulse Rate:  [66-84] 71  (08/17 0608) Resp:  [14-19] 18  (08/17 0608) BP: (110-139)/(57-99) 139/79 mmHg (08/17 0608) SpO2:  [98 %-100 %] 98 % (08/17 0608) Last BM Date: 08/10/12  Intake/Output from previous day: 08/16 0701 - 08/17 0700 In: 1300 [I.V.:1300] Out: 1050 [Urine:1025; Blood:25] Intake/Output this shift:    General appearance: alert, cooperative, appears stated age and no distress Chest: CTA bilaterally Cardiac: RRR No M/R/G Perirectal: exterior dressing changed,small amount of bloody serous drainage on dressing, wound itself appears clean no further drainage noted, no exterior erythema or induration noted on exam,packing to remain until tomorrow. VSS, afebrile,labs pending.  Lab Results:   [facility] 08/10/12 1456  WBC 16.9*  HGB 12.1*  HCT 35.6*  PLT 352   BMET  Basename 08/10/12 1456  NA 140  K 3.2*  CL 101  CO2 29  GLUCOSE 107*  BUN 7  CREATININE 1.18  CALCIUM 9.3   PT/INR No results found for this basename: LABPROT:2,INR:2 in the last 72 hours ABG No results found for this basename: PHART:2,PCO2:2,PO2:2,HCO3:2 in the last 72 hours  Studies/Results: Ct Abdomen Pelvis W Contrast  08/10/2012  *RADIOLOGY REPORT*  Clinical Data: Left flank pain beginning 3 days ago.  Hematuria.  CT ABDOMEN AND PELVIS WITH CONTRAST  Technique:  Multidetector CT imaging of the abdomen and pelvis was performed following the standard protocol during bolus administration of intravenous contrast.  Contrast: OMNIPAQUE IOHEXOL 300 MG/ML  SOLN  Comparison: CT abdomen and pelvis 11/21/2007.  Findings: There is some dependent atelectasis in the left lung base.  No pleural or pericardial effusion.  A 0.6 cm stone is seen in the mid pole of the left  kidney.  Low attenuating lesions are present in both kidneys.  The largest is on the right measuring 3.2 cm in diameter.  Some of these are too small to definitively characterize but are most consistent with cysts.  The kidneys otherwise appear normal.  There is no hydronephrosis.  No ureteral stones are identified.  The urinary bladder and seminal vesicles are unremarkable.  The prostate gland is mildly prominent.  The patient is status post cholecystectomy.  Three to four punctate calcifications in the liver are unchanged.  The liver otherwise appears normal.  The spleen, adrenal glands, biliary tree and pancreas appear normal.  There is a right perirectal fluid collection measuring 2.4 x 1.7 cm the axial plane by 4.3 cm cranial- caudal.  There is infiltration of the adjacent subcutaneous fat. No other fluid collection is seen.  The stomach, small and large bowel and appendix appear normal. There is no focal bony abnormality.  IMPRESSION:  1.  Findings consistent with a right perirectal abscess. 2.  Nonobstructing stone upper pole left kidney. 3.  Low attenuating lesions both kidneys most consistent with cysts.  Original Report Authenticated By: Bernadene Bell. Maricela Curet, M.D.    Anti-infectives: Anti-infectives     Start     Dose/Rate Route Frequency Ordered Stop   08/10/12 2315   metroNIDAZOLE (FLAGYL) IVPB 500 mg        500 mg 100 mL/hr over 60 Minutes Intravenous Every 8 hours 08/10/12 2310     08/10/12 2315   ciprofloxacin (CIPRO) IVPB 400 mg  400 mg 200 mL/hr over 60 Minutes Intravenous Every 12 hours 08/10/12 2310     08/10/12 2000   clindamycin (CLEOCIN) IVPB 900 mg        900 mg 100 mL/hr over 30 Minutes Intravenous  Once 08/10/12 1948 08/10/12 2038          Assessment/Plan: s/p Procedure(s) (LRB): IRRIGATION AND DEBRIDEMENT PERIRECTAL ABSCESS (N/A) PLACEMENT OF SETON (N/A)  1. Advance to regular diet 2. Continue with IV abx for now pending labs. 3. Ambulate 4. Plan to  remove packing tomorrow 5. Begin Sitz baths tomorrow  LOS: 2 days    Cameron Petty 08/12/2012

## 2012-08-12 NOTE — Progress Notes (Signed)
Agree Cameron Incorvaia, MD, MPH, FACS Pager: [phone]  

## 2012-08-13 MED ORDER — CIPROFLOXACIN HCL 500 MG PO TABS
500.0000 mg | ORAL_TABLET | Freq: Two times a day (BID) | ORAL | Status: DC
  Administered 2012-08-13 – 2012-08-15 (×5): 500 mg via ORAL
  Filled 2012-08-13 (×8): qty 1

## 2012-08-13 MED ORDER — OXYCODONE HCL 5 MG PO TABS
5.0000 mg | ORAL_TABLET | ORAL | Status: DC | PRN
  Administered 2012-08-13: 5 mg via ORAL
  Filled 2012-08-13: qty 1

## 2012-08-13 MED ORDER — METRONIDAZOLE 500 MG PO TABS
500.0000 mg | ORAL_TABLET | Freq: Three times a day (TID) | ORAL | Status: DC
  Administered 2012-08-13 – 2012-08-15 (×7): 500 mg via ORAL
  Filled 2012-08-13 (×10): qty 1

## 2012-08-13 NOTE — Progress Notes (Signed)
2 Days Post-Op  Subjective: Still with moderate pain  Objective: Vital signs in last 24 hours: Temp:  [98.6 F (37 C)-98.8 F (37.1 C)] 98.8 F (37.1 C) (08/18 0523) Pulse Rate:  [64-66] 65  (08/18 0523) Resp:  [18-19] 19  (08/18 0523) BP: (124-133)/(56-69) 133/65 mmHg (08/18 0523) SpO2:  [97 %-98 %] 98 % (08/18 0523) Last BM Date: 08/10/12  Intake/Output from previous day: 08/17 0701 - 08/18 0700 In: 480 [P.O.:480] Out: 1400 [Urine:1400] Intake/Output this shift:    Packing removed, wound clean with some drainage Seton in place  Lab Results:   Basename 08/12/12 0949 08/10/12 1456  WBC 10.1 16.9*  HGB 11.7* 12.1*  HCT 35.9* 35.6*  PLT 362 352   BMET  Basename 08/12/12 0949 08/10/12 1456  NA 140 140  K 3.9 3.2*  CL 104 101  CO2 28 29  GLUCOSE 112* 107*  BUN 5* 7  CREATININE 1.02 1.18  CALCIUM 9.0 9.3   PT/INR No results found for this basename: LABPROT:2,INR:2 in the last 72 hours ABG No results found for this basename: PHART:2,PCO2:2,PO2:2,HCO3:2 in the last 72 hours  Studies/Results: No results found.  Anti-infectives: Anti-infectives     Start     Dose/Rate Route Frequency Ordered Stop   08/10/12 2315   metroNIDAZOLE (FLAGYL) IVPB 500 mg        500 mg 100 mL/hr over 60 Minutes Intravenous Every 8 hours 08/10/12 2310     08/10/12 2315   ciprofloxacin (CIPRO) IVPB 400 mg        400 mg 200 mL/hr over 60 Minutes Intravenous Every 12 hours 08/10/12 2310     08/10/12 2000   clindamycin (CLEOCIN) IVPB 900 mg        900 mg 100 mL/hr over 30 Minutes Intravenous  Once 08/10/12 1948 08/10/12 2038          Assessment/Plan: s/p Procedure(s) (LRB): IRRIGATION AND DEBRIDEMENT PERIRECTAL ABSCESS (N/A) PLACEMENT OF SETON (N/A)  Start oral pain meds, sitz baths today Continue antibiotics  LOS: 3 days    Kazuo Durnil A 08/13/2012

## 2012-08-14 MED ORDER — ATORVASTATIN CALCIUM 40 MG PO TABS
40.0000 mg | ORAL_TABLET | Freq: Every day | ORAL | Status: DC
  Administered 2012-08-14: 40 mg via ORAL
  Filled 2012-08-14 (×2): qty 1

## 2012-08-14 MED ORDER — OXYCODONE HCL 5 MG PO TABS
5.0000 mg | ORAL_TABLET | ORAL | Status: DC | PRN
  Administered 2012-08-14 – 2012-08-15 (×3): 10 mg via ORAL
  Filled 2012-08-14 (×3): qty 2

## 2012-08-14 MED ORDER — CLOPIDOGREL BISULFATE 75 MG PO TABS
75.0000 mg | ORAL_TABLET | Freq: Every day | ORAL | Status: DC
  Administered 2012-08-14 – 2012-08-15 (×2): 75 mg via ORAL
  Filled 2012-08-14 (×3): qty 1

## 2012-08-14 MED ORDER — ASPIRIN EC 81 MG PO TBEC
81.0000 mg | DELAYED_RELEASE_TABLET | Freq: Every day | ORAL | Status: DC
  Administered 2012-08-14: 81 mg via ORAL
  Filled 2012-08-14 (×2): qty 1

## 2012-08-14 NOTE — Progress Notes (Signed)
Sore and daughter returning from out of town tonight so will plan d/c in AM Patient examined and I agree with the assessment and plan  Violeta Gelinas, MD, MPH, FACS Pager: [phone]  08/14/2012 1:38 PM

## 2012-08-14 NOTE — Progress Notes (Signed)
Patient ID: Cameron Petty, male   DOB: 1956/09/12, 56 y.o.   MRN: [id number] 3 Days Post-Op  Subjective: Still with moderate pain, small amount of serous drainage on bandage.  Objective: Vital signs in last 24 hours: Temp:  [98.6 F (37 C)-98.9 F (37.2 C)] 98.6 F (37 C) (08/19 0619) Pulse Rate:  [63-76] 63  (08/19 0619) Resp:  [18] 18  (08/19 0619) BP: (147-165)/(57-77) 147/57 mmHg (08/19 0619) SpO2:  [99 %-100 %] 99 % (08/19 0619) Last BM Date: 08/13/12  Intake/Output from previous day: 08/18 0701 - 08/19 0700 In: 720 [P.O.:720] Out: 650 [Urine:650] Intake/Output this shift:    Packing removed, wound clean with some drainage Seton in place, did sitz bath x 1 only yesterday. VSS, febrile, no labs today  Lab Results:   [facility] 08/12/12 0949  WBC 10.1  HGB 11.7*  HCT 35.9*  PLT 362   BMET  Basename 08/12/12 0949  NA 140  K 3.9  CL 104  CO2 28  GLUCOSE 112*  BUN 5*  CREATININE 1.02  CALCIUM 9.0   PT/INR No results found for this basename: LABPROT:2,INR:2 in the last 72 hours ABG No results found for this basename: PHART:2,PCO2:2,PO2:2,HCO3:2 in the last 72 hours  Studies/Results: No results found.  Anti-infectives: Anti-infectives     Start     Dose/Rate Route Frequency Ordered Stop   08/13/12 0900   ciprofloxacin (CIPRO) tablet 500 mg        500 mg Oral 2 times daily 08/13/12 0827     08/13/12 0900   metroNIDAZOLE (FLAGYL) tablet 500 mg        500 mg Oral 3 times per day 08/13/12 0827     08/10/12 2315   metroNIDAZOLE (FLAGYL) IVPB 500 mg  Status:  Discontinued        500 mg 100 mL/hr over 60 Minutes Intravenous Every 8 hours 08/10/12 2310 08/13/12 0827   08/10/12 2315   ciprofloxacin (CIPRO) IVPB 400 mg  Status:  Discontinued        400 mg 200 mL/hr over 60 Minutes Intravenous Every 12 hours 08/10/12 2310 08/13/12 0827   08/10/12 2000   clindamycin (CLEOCIN) IVPB 900 mg        900 mg 100 mL/hr over 30 Minutes Intravenous  Once 08/10/12  1948 08/10/12 2038          Assessment/Plan: s/p Procedure(s) (LRB): IRRIGATION AND DEBRIDEMENT PERIRECTAL ABSCESS (N/A) PLACEMENT OF SETON (N/A)   Continue antibiotics ? Discharge today will discuss with Dr. Janee Morn   LOS: 4 days    Blenda Mounts 08/14/2012

## 2012-08-14 NOTE — Discharge Summary (Signed)
Physician Discharge Summary  Patient ID: Cameron Petty MRN: [id number] DOB/AGE: January 26, 1956 56 y.o.  Admit date: 08/10/2012 Discharge date: 08/14/2012  Admission Diagnoses: Perirectal Abscess   Discharge Diagnoses: Perirectal Abscess, fistula  Active Problems:  * No active hospital problems. *    PROCEDURES: IRRIGATION AND DEBRIDEMENT PERIRECTAL ABSCESS (N/A)  PLACEMENT OF SETON (N/A)  08/11/12 Dr. Carie Caddy Course: We were asked to see patient at the request of Dr Silverio Lay due to 1 day history of right buttock pain itching and burning. Had some left flank pain but that has resolved. No drainage. Pain was severe especially when pushed. He was found to have a large perirectal abscess and underwent the procedure listed above.  He underwent local wound care after the I/D that was complicated by a far amount of local pain therefore the patient required an extended hospital stay.  On 8/20 he was tolerating the dressing changes much better and doing sitz baths.  It was felt that he could be discharged home.  Follow up: Dr Carolynne Edouard  2 weeks  Condition on d/c:  Improved.  Disposition: Home on antibiotics for 5 more days and dressing changes.  Also sitz baths 3 times daily.   Medication List  As of 08/14/2012 10:45 AM   ASK your doctor about these medications         aspirin EC 81 MG tablet   Take 81 mg by mouth daily.      atorvastatin 40 MG tablet   Commonly known as: LIPITOR   Take 40 mg by mouth daily.      carvedilol 3.125 MG tablet   Commonly known as: COREG   Take 3.125 mg by mouth 2 (two) times daily with a meal.      clopidogrel 75 MG tablet   Commonly known as: PLAVIX   Take 75 mg by mouth daily.      lisinopril 10 MG tablet   Commonly known as: PRINIVIL,ZESTRIL   Take 10 mg by mouth daily.             Signed: JENNINGS,WILLARD 08/14/2012, 10:45 AM and WHITE, ELIZABETH 9:28 AM 08/15/2012

## 2012-08-15 ENCOUNTER — Encounter (HOSPITAL_COMMUNITY): Payer: Self-pay | Admitting: General Surgery

## 2012-08-15 MED ORDER — OXYCODONE HCL 5 MG PO TABS: 5.0000 mg | ORAL_TABLET | ORAL | Status: AC | PRN

## 2012-08-15 MED ORDER — CIPROFLOXACIN HCL 500 MG PO TABS: 500.0000 mg | ORAL_TABLET | Freq: Two times a day (BID) | ORAL | Status: AC

## 2012-08-15 NOTE — Discharge Summary (Signed)
Clayborn Milnes, MD, MPH, FACS Pager: [phone]  

## 2012-08-15 NOTE — Discharge Instructions (Signed)
Do Sitz baths 3 times daily.  Replace a dry dressing over wound after sitz baths and as needed during the day.  Call our office with questions or concerns.  Take antibiotic as directed.  Activity as tolerated.  Follow up in 10-14 days.  Call our office if you do not receive an appointment day and time.  Peri-Rectal Abscess Your caregiver has diagnosed you as having a peri-rectal abscess. This is an infected area near the rectum that is filled with pus. If the abscess is near the surface of the skin, your caregiver may open (incise) the area and drain the pus. HOME CARE INSTRUCTIONS   If your abscess was opened up and drained. A small piece of gauze may be placed in the opening so that it can drain. Do not remove the gauze unless directed by your caregiver.   A loose dressing may be placed over the abscess site. Change the dressing as often as necessary to keep it clean and dry.   After the drain is removed, the area may be washed with a gentle antiseptic (soap) four times per day.   A warm sitz bath, warm packs or heating pad may be used for pain relief, taking care not to burn yourself.   Return for a wound check in 1 day or as directed.   An "inflatable doughnut" may be used for sitting with added comfort. These can be purchased at a drugstore or medical supply house.   To reduce pain and straining with bowel movements, eat a high fiber diet with plenty of fruits and vegetables. Use stool softeners as recommended by your caregiver. This is especially important if narcotic type pain medications were prescribed as these may cause marked constipation.   Only take over-the-counter or prescription medicines for pain, discomfort, or fever as directed by your caregiver.  SEEK IMMEDIATE MEDICAL CARE IF:   You have increasing pain that is not controlled by medication.   There is increased inflammation (redness), swelling, bleeding, or drainage from the area.   An oral temperature  above 102 F (38.9 C) develops.   You develop chills or generalized malaise (feel lethargic or feel "washed out").   You develop any new symptoms (problems) you feel may be related to your present problem.  Document Released: 12/10/2000 Document Revised: 12/02/2011 Document Reviewed: 12/10/2008 [facility] Patient Information 2012 [place], [place].Anal Fistula An anal fistula is an abnormal tunnel that leads from the anal canal (which carries stool from the large intestine) to a hole in the skin near the anus (the opening through which stool passes out of your body).  CAUSES  Food you eat goes from your stomach into your intestine. As the food is digested, waste material (stool) forms. Stool passes through your large intestine, through the rectum and anal canal, and out of your body through the anus.  The anus has a number of tiny glands (clusters of specialized cells) that make lubricating fluid. Sometimes these glands can become infected. This type of infection may lead to the development of a pocket of pus (abscess). An anal fistula often develops after an infection or abscess; It is nearly always caused by a past anorectal abscess. You are at a higher risk of developing an anal fistula if you have:  Had an anal abscess.   Chronic inflammatory bowel disease, such as Crohn's disease or ulcerative colitis.   Conditions in which there are inflamed outpouchings of the intestinal wall (diverticulitis).   Colon or rectal cancer.  Sexually transmitted diseases involving the rectum, such as gonorrhea or chlamydia.   A history of anal radiation treatments, injury, or surgery.   An HIV infection.   A problem that has required treatment with steroid medicines for more than a short time.  SYMPTOMS   Anal pain, particularly around the area of a past abscess.   Drainage of pus, blood, stool or mucus from an opening in the skin.   Swelling around the skin opening.   Worn off skin around the  opening.   A hot or red area near the anus.   Diarrhea.   Fever and chills.   Tiredness (fatigue).  DIAGNOSIS   In some cases, the opening of an anal fistula is easily seen during a physical exam.   A probe or scope may be used to help locate the opening of the fistula. In some cases, dye can be injected into the fistula opening, and X-rays can be taken to find the exact location and path of the fistula.   A sample (biopsy) of the fistula tissue or anus may be taken to check for cancer.  TREATMENT   An anal fistula may need surgery to open it up and allow it to heal. This type of operation is called a fistulotomy.   A specialized kind of glue or plug to seal the fistula may be used.   An antibiotic may be prescribed to treat an existing infection.  HOME CARE INSTRUCTIONS   Take medications (such as antibiotics) as prescribed by your caregiver.   Only take over-the-counter or prescription medicine for pain, discomfort, or fever as directed by your caregiver.   Follow your prescribed diet. You may need a higher fiber diet to help avoid constipation.   Drink lots of water as directed.   Use a stool softener or laxative, if recommended.   A warm sitz bath several times a day may be soothing, as well as help with healing.   Follow excellent hygiene to keep the anal area as clean as possible. Consider using pre-moistened towelettes to keep the anal area clean after using the bathroom.  SEEK MEDICAL CARE IF:  You have increased pain not controlled with medications.   You notice new swelling, redness, or hotness in the anal area.   You develop any problems passing urine.   You develop a fever (more than 100.5 F (38.1 C).  SEEK IMMEDIATE MEDICAL CARE IF:  You have severe, intolerable pain.   You have severe problems passing urine or cannot pass any urine at all.   You develop an unexplained oral temperature above 102.0 F (38.9 C).   You notice new or worsening  leakage of blood, pus, mucus, or stool.  Document Released: 11/25/2008 Document Revised: 12/02/2011 Document Reviewed: 11/25/2008 [facility] Patient Information 2012 [place], [place].

## 2012-09-04 ENCOUNTER — Telehealth (INDEPENDENT_AMBULATORY_CARE_PROVIDER_SITE_OTHER): Payer: Self-pay | Admitting: General Surgery

## 2012-09-04 NOTE — Telephone Encounter (Signed)
Message copied by Littie Deeds on Mon Sep 04, 2012 11:55 AM ------      Message from: Marin Shutter      Created: Mon Sep 04, 2012 10:02 AM      Regarding: Dr. Carolynne Edouard      Contact: [phone]       Had sx perirectal abcess on 8/15.  Needs a p/o appt

## 2012-09-04 NOTE — Telephone Encounter (Signed)
Spoke with pt and let him know that he will have his first PO appt with Dr. Carolynne Edouard on 9/11 at 3:30

## 2012-09-06 ENCOUNTER — Encounter (INDEPENDENT_AMBULATORY_CARE_PROVIDER_SITE_OTHER): Payer: Medicaid Other | Admitting: General Surgery

## 2012-09-06 ENCOUNTER — Telehealth (INDEPENDENT_AMBULATORY_CARE_PROVIDER_SITE_OTHER): Payer: Self-pay | Admitting: General Surgery

## 2012-09-06 NOTE — Telephone Encounter (Signed)
Message copied by Littie Deeds on Wed Sep 06, 2012  2:09 PM ------      Message from: Cathi Roan      Created: Wed Sep 06, 2012  8:58 AM      Regarding: Post op      Contact: [phone]       Has to get kids off bus today and is concerned that his stitches need to be removed. Needs new appt.

## 2012-09-06 NOTE — Telephone Encounter (Signed)
Spoke with Cameron Petty and let him know that the next available appt I had for Dr. Carolynne Edouard would be 9/18 at 4:20.  He said this would be fine so I went ahead and gave him that appt slot.

## 2012-09-11 ENCOUNTER — Ambulatory Visit (INDEPENDENT_AMBULATORY_CARE_PROVIDER_SITE_OTHER): Payer: Medicaid Other | Admitting: General Surgery

## 2012-09-11 ENCOUNTER — Encounter (INDEPENDENT_AMBULATORY_CARE_PROVIDER_SITE_OTHER): Payer: Self-pay | Admitting: General Surgery

## 2012-09-11 VITALS — BP 172/88 | HR 76 | Temp 97.2°F | Resp 20 | Ht 70.0 in | Wt 221.8 lb

## 2012-09-11 DIAGNOSIS — K603 Anal fistula: Secondary | ICD-10-CM

## 2012-09-11 NOTE — Patient Instructions (Signed)
Shower daily Keep area clean and dry. Place dry gauze on rectum daily as needed

## 2012-09-18 NOTE — Progress Notes (Signed)
Subjective:     Patient ID: Cameron Petty, male   DOB: 1956/08/14, 56 y.o.   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 He continues to have some soreness in the area. He is having a small amount of drainage. He denies any fevers. His bowels are beginning to work more normally.  Review of Systems     Objective:   Physical Exam On exam his operative site at the perirectal area appears to be healing reasonably well. The seton was in place. The wound looks clean.    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 We will plan to see him back in about a month to check his progress.

## 2012-10-12 ENCOUNTER — Encounter (INDEPENDENT_AMBULATORY_CARE_PROVIDER_SITE_OTHER): Payer: Self-pay | Admitting: General Surgery

## 2012-10-12 ENCOUNTER — Ambulatory Visit (INDEPENDENT_AMBULATORY_CARE_PROVIDER_SITE_OTHER): Payer: Medicaid Other | Admitting: General Surgery

## 2012-10-12 VITALS — BP 176/94 | HR 68 | Temp 97.8°F | Resp 18 | Ht 70.0 in | Wt 219.8 lb

## 2012-10-12 DIAGNOSIS — K603 Anal fistula: Secondary | ICD-10-CM

## 2012-10-12 NOTE — Patient Instructions (Signed)
Keep area clean and dry with gauze tucked on rectum everyday

## 2012-10-13 NOTE — Progress Notes (Signed)
Subjective:     Patient ID: NEERAJ HOUSAND, male   DOB: 1956/09/19, 56 y.o.   MRN: [id number]  HPI The patient is a 56 year old black male who is about 6 weeks status post placement of seton for an anal fistula. He states he did have some drainage yesterday. His soreness is gradually improving. He occasionally has problems with constipation but his bowels seem to be moving better and more regular now.  Review of Systems     Objective:   Physical Exam On exam the seton was in place. We were able to tighten it up with a 0 silk tie the day and he tolerated this well. He did have a little bit of drainage and external to where the seton is but I could not probe an abscess cavity at this location.    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 We'll plan to see him back in one month to check his progress

## 2012-11-14 ENCOUNTER — Ambulatory Visit (INDEPENDENT_AMBULATORY_CARE_PROVIDER_SITE_OTHER): Payer: Medicaid Other | Admitting: General Surgery

## 2012-11-14 ENCOUNTER — Encounter (INDEPENDENT_AMBULATORY_CARE_PROVIDER_SITE_OTHER): Payer: Self-pay | Admitting: General Surgery

## 2012-11-14 VITALS — BP 122/72 | HR 90 | Temp 98.1°F | Ht 70.0 in | Wt 224.8 lb

## 2012-11-14 DIAGNOSIS — L03319 Cellulitis of trunk, unspecified: Secondary | ICD-10-CM

## 2012-11-14 DIAGNOSIS — K603 Anal fistula: Secondary | ICD-10-CM

## 2012-11-14 DIAGNOSIS — L02215 Cutaneous abscess of perineum: Secondary | ICD-10-CM

## 2012-11-14 MED ORDER — CIPROFLOXACIN HCL 500 MG PO TABS: 500.0000 mg | ORAL_TABLET | Freq: Two times a day (BID) | ORAL | Status: DC

## 2012-11-14 NOTE — Patient Instructions (Signed)
Plan for incision and drainage of new abscess

## 2012-11-14 NOTE — Progress Notes (Signed)
Subjective:     Patient ID: Cameron Petty, male   DOB: 1956-06-23, 56 y.o.   MRN: [id number]  HPI The patient is a a 56 year old black male who is several months out from placement of a seton for an anal fistula. He has been gradually improving but today he notes worsening pain in the right peroneal and inner thigh area. He denies any fevers or drainage. His bowels have been moving regularly.  Review of Systems  Constitutional: Negative.   HENT: Negative.   Eyes: Negative.   Respiratory: Negative.   Cardiovascular: Negative.   Gastrointestinal: Negative.   Genitourinary: Negative.   Musculoskeletal: Negative.   Skin: Negative.   Neurological: Negative.   Hematological: Negative.   Psychiatric/Behavioral: Negative.        Objective:   Physical Exam  Constitutional: He is oriented to person, place, and time. He appears well-developed and well-nourished.  HENT:  Head: Normocephalic and atraumatic.  Eyes: Conjunctivae normal and EOM are normal. Pupils are equal, round, and reactive to light.  Neck: Normal range of motion. Neck supple.  Cardiovascular: Normal rate, regular rhythm and normal heart sounds.   Pulmonary/Chest: Effort normal and breath sounds normal.  Abdominal: Soft. Bowel sounds are normal.  Genitourinary:       The seton is in place and the perirectal skin looks good. In the right peroneal and inner thigh area he has a new small abscess. He would not allow me to incise and drain it today in the clinic.  Musculoskeletal: Normal range of motion.  Neurological: He is alert and oriented to person, place, and time.  Skin: Skin is warm and dry.  Psychiatric: He has a normal mood and affect. His behavior is normal.       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 This area is clean and doing well. He also has a new right perineal inner greater needle thigh abscess. This area will need to be drained. I've  discussed with him in detail the risks and benefits of the operation to do this as well as some of the technical aspects and he understands and wishes to proceed. He would like to try to wait until after Thanksgiving. He understands that if he cannot make it through a surgery date he'll have to go through the emergency department.    Plan:     Plan to incise and drain the new right perineal abscess

## 2012-11-15 MED ORDER — CIPROFLOXACIN IN D5W 400 MG/200ML IV SOLN
400.0000 mg | INTRAVENOUS | Status: AC
  Administered 2012-11-16: 400 mg via INTRAVENOUS
  Filled 2012-11-15: qty 200

## 2012-11-16 ENCOUNTER — Encounter (HOSPITAL_COMMUNITY): Payer: Self-pay | Admitting: Pharmacy Technician

## 2012-11-16 ENCOUNTER — Ambulatory Visit (HOSPITAL_COMMUNITY): Payer: Medicaid Other | Admitting: Certified Registered Nurse Anesthetist

## 2012-11-16 ENCOUNTER — Encounter (HOSPITAL_COMMUNITY): Payer: Self-pay | Admitting: *Deleted

## 2012-11-16 ENCOUNTER — Encounter (HOSPITAL_COMMUNITY): Payer: Self-pay | Admitting: Certified Registered Nurse Anesthetist

## 2012-11-16 ENCOUNTER — Ambulatory Visit (HOSPITAL_COMMUNITY)
Admission: RE | Admit: 2012-11-16 | Discharge: 2012-11-16 | Disposition: A | Discharge status: Home / Self Care | Payer: Medicaid Other | Source: Ambulatory Visit | Attending: General Surgery | Admitting: General Surgery

## 2012-11-16 ENCOUNTER — Ambulatory Visit (HOSPITAL_COMMUNITY): Payer: Medicaid Other

## 2012-11-16 ENCOUNTER — Encounter (HOSPITAL_COMMUNITY)
Admission: RE | Disposition: A | Discharge status: Home / Self Care | Payer: Self-pay | Source: Ambulatory Visit | Attending: General Surgery

## 2012-11-16 DIAGNOSIS — I10 Essential (primary) hypertension: Secondary | ICD-10-CM | POA: Insufficient documentation

## 2012-11-16 DIAGNOSIS — F17200 Nicotine dependence, unspecified, uncomplicated: Secondary | ICD-10-CM | POA: Insufficient documentation

## 2012-11-16 DIAGNOSIS — L02219 Cutaneous abscess of trunk, unspecified: Secondary | ICD-10-CM | POA: Insufficient documentation

## 2012-11-16 DIAGNOSIS — L02215 Cutaneous abscess of perineum: Secondary | ICD-10-CM

## 2012-11-16 DIAGNOSIS — I739 Peripheral vascular disease, unspecified: Secondary | ICD-10-CM | POA: Insufficient documentation

## 2012-11-16 DIAGNOSIS — L03319 Cellulitis of trunk, unspecified: Secondary | ICD-10-CM

## 2012-11-16 DIAGNOSIS — I252 Old myocardial infarction: Secondary | ICD-10-CM | POA: Insufficient documentation

## 2012-11-16 DIAGNOSIS — Z01812 Encounter for preprocedural laboratory examination: Secondary | ICD-10-CM | POA: Insufficient documentation

## 2012-11-16 DIAGNOSIS — Z01818 Encounter for other preprocedural examination: Secondary | ICD-10-CM | POA: Insufficient documentation

## 2012-11-16 HISTORY — DX: Cerebral infarction, unspecified: I63.9

## 2012-11-16 HISTORY — PX: INCISION AND DRAINAGE ABSCESS: SHX5864

## 2012-11-16 HISTORY — DX: Acute myocardial infarction, unspecified: I21.9

## 2012-11-16 LAB — BASIC METABOLIC PANEL
Chloride: 104 mEq/L (ref 96–112)
GFR calc Af Amer: 85 mL/min — ABNORMAL LOW (ref >90)
GFR calc non Af Amer: 74 mL/min — ABNORMAL LOW (ref >90)
Potassium: 4.4 mEq/L (ref 3.5–5.1)

## 2012-11-16 LAB — CBC
HCT: 40.2 % (ref 39.0–52.0)
Hemoglobin: 13.3 g/dL (ref 13.0–17.0)
RDW: 14.8 % (ref 11.5–15.5)
WBC: 11.7 10*3/uL — ABNORMAL HIGH (ref 4.0–10.5)

## 2012-11-16 LAB — SURGICAL PCR SCREEN
MRSA, PCR: POSITIVE — AB
Staphylococcus aureus: POSITIVE — AB

## 2012-11-16 SURGERY — INCISION AND DRAINAGE, ABSCESS
Anesthesia: General | Site: Leg Upper | Wound class: Dirty or Infected

## 2012-11-16 MED ORDER — LACTATED RINGERS IV SOLN
INTRAVENOUS | Status: DC | PRN
  Administered 2012-11-16: 12:00:00 via INTRAVENOUS

## 2012-11-16 MED ORDER — OXYCODONE HCL 5 MG/5ML PO SOLN: 5.0000 mg | Freq: Once | ORAL | Status: DC | PRN

## 2012-11-16 MED ORDER — MIDAZOLAM HCL 5 MG/5ML IJ SOLN
INTRAMUSCULAR | Status: DC | PRN
  Administered 2012-11-16: 2 mg via INTRAVENOUS

## 2012-11-16 MED ORDER — MUPIROCIN 2 % EX OINT
TOPICAL_OINTMENT | CUTANEOUS | Status: AC
  Administered 2012-11-16: 1 via NASAL
  Filled 2012-11-16: qty 22

## 2012-11-16 MED ORDER — BUPIVACAINE HCL (PF) 0.25 % IJ SOLN
INTRAMUSCULAR | Status: AC
  Filled 2012-11-16: qty 30

## 2012-11-16 MED ORDER — CHLORHEXIDINE GLUCONATE 4 % EX LIQD: 1.0000 "application " | Freq: Once | CUTANEOUS | Status: DC

## 2012-11-16 MED ORDER — PROPOFOL 10 MG/ML IV BOLUS
INTRAVENOUS | Status: DC | PRN
  Administered 2012-11-16: 200 mg via INTRAVENOUS

## 2012-11-16 MED ORDER — FENTANYL CITRATE 0.05 MG/ML IJ SOLN
INTRAMUSCULAR | Status: DC | PRN
  Administered 2012-11-16: 50 ug via INTRAVENOUS

## 2012-11-16 MED ORDER — METOCLOPRAMIDE HCL 5 MG/ML IJ SOLN: 10.0000 mg | Freq: Once | INTRAMUSCULAR | Status: DC | PRN

## 2012-11-16 MED ORDER — BUPIVACAINE-EPINEPHRINE 0.25% -1:200000 IJ SOLN
INTRAMUSCULAR | Status: DC | PRN
  Administered 2012-11-16: 10 mL

## 2012-11-16 MED ORDER — 0.9 % SODIUM CHLORIDE (POUR BTL) OPTIME
TOPICAL | Status: DC | PRN
  Administered 2012-11-16: 1000 mL

## 2012-11-16 MED ORDER — CARVEDILOL 3.125 MG PO TABS
3.1250 mg | ORAL_TABLET | Freq: Once | ORAL | Status: AC
  Administered 2012-11-16: 3.125 mg via ORAL
  Filled 2012-11-16 (×2): qty 1

## 2012-11-16 MED ORDER — HYDROMORPHONE HCL PF 1 MG/ML IJ SOLN
0.2500 mg | INTRAMUSCULAR | Status: DC | PRN
Start: 2012-11-16 — End: 2012-11-16

## 2012-11-16 MED ORDER — HYDROCODONE-ACETAMINOPHEN 5-325 MG PO TABS: 1.0000 | ORAL_TABLET | Freq: Four times a day (QID) | ORAL | Status: DC | PRN

## 2012-11-16 MED ORDER — OXYCODONE HCL 5 MG PO TABS: 5.0000 mg | ORAL_TABLET | Freq: Once | ORAL | Status: DC | PRN

## 2012-11-16 MED ORDER — LIDOCAINE HCL (CARDIAC) 20 MG/ML IV SOLN
INTRAVENOUS | Status: DC | PRN
  Administered 2012-11-16: 100 mg via INTRAVENOUS

## 2012-11-16 MED ORDER — LACTATED RINGERS IV SOLN
INTRAVENOUS | Status: DC
  Administered 2012-11-16: 12:00:00 via INTRAVENOUS

## 2012-11-16 MED ORDER — MUPIROCIN 2 % EX OINT
TOPICAL_OINTMENT | Freq: Once | CUTANEOUS | Status: AC
  Administered 2012-11-16: 1 via NASAL

## 2012-11-16 SURGICAL SUPPLY — 31 items
BANDAGE GAUZE ELAST BULKY 4 IN (GAUZE/BANDAGES/DRESSINGS) IMPLANT
CANISTER SUCTION 2500CC (MISCELLANEOUS) ×2 IMPLANT
CLOTH BEACON ORANGE TIMEOUT ST (SAFETY) ×2 IMPLANT
COVER SURGICAL LIGHT HANDLE (MISCELLANEOUS) ×2 IMPLANT
DRAPE LAPAROSCOPIC ABDOMINAL (DRAPES) ×2 IMPLANT
DRAPE UTILITY 15X26 W/TAPE STR (DRAPE) ×4 IMPLANT
DRSG PAD ABDOMINAL 8X10 ST (GAUZE/BANDAGES/DRESSINGS) IMPLANT
ELECT CAUTERY BLADE 6.4 (BLADE) ×1 IMPLANT
ELECT REM PT RETURN 9FT ADLT (ELECTROSURGICAL) ×2
ELECTRODE REM PT RTRN 9FT ADLT (ELECTROSURGICAL) ×1 IMPLANT
GLOVE BIO SURGEON STRL SZ 6.5 (GLOVE) ×1 IMPLANT
GLOVE BIOGEL PI IND STRL 6.5 (GLOVE) IMPLANT
GLOVE BIOGEL PI INDICATOR 6.5 (GLOVE) ×1
GLOVE EUDERMIC 7 POWDERFREE (GLOVE) ×2 IMPLANT
GOWN STRL NON-REIN LRG LVL3 (GOWN DISPOSABLE) ×2 IMPLANT
GOWN STRL REIN XL XLG (GOWN DISPOSABLE) ×2 IMPLANT
KIT BASIN OR (CUSTOM PROCEDURE TRAY) ×2 IMPLANT
KIT ROOM TURNOVER OR (KITS) ×2 IMPLANT
LEGGING LITHOTOMY PAIR STRL (DRAPES) ×1 IMPLANT
NDL HYPO 25GX1X1/2 BEV (NEEDLE) IMPLANT
NEEDLE HYPO 25GX1X1/2 BEV (NEEDLE) ×2 IMPLANT
NS IRRIG 1000ML POUR BTL (IV SOLUTION) ×2 IMPLANT
PACK GENERAL/GYN (CUSTOM PROCEDURE TRAY) ×2 IMPLANT
PAD ARMBOARD 7.5X6 YLW CONV (MISCELLANEOUS) ×4 IMPLANT
SPECIMEN JAR SMALL (MISCELLANEOUS) IMPLANT
SPONGE GAUZE 4X4 12PLY (GAUZE/BANDAGES/DRESSINGS) ×2 IMPLANT
SWAB COLLECTION DEVICE MRSA (MISCELLANEOUS) IMPLANT
SYR CONTROL 10ML LL (SYRINGE) ×1 IMPLANT
TOWEL OR 17X24 6PK STRL BLUE (TOWEL DISPOSABLE) ×2 IMPLANT
TOWEL OR 17X26 10 PK STRL BLUE (TOWEL DISPOSABLE) ×2 IMPLANT
TUBE ANAEROBIC SPECIMEN COL (MISCELLANEOUS) IMPLANT

## 2012-11-16 NOTE — Progress Notes (Signed)
Requested notes/ekg/echo from Dr. Annitta Jersey office.

## 2012-11-16 NOTE — Transfer of Care (Signed)
Immediate Anesthesia Transfer of Care Note  Patient: Cameron Petty  Procedure(s) Performed: Procedure(s) (LRB) with comments: INCISION AND DRAINAGE ABSCESS (N/A) - incision and drainage perineal abscess  Patient Location: PACU  Anesthesia Type:General  Level of Consciousness: awake, alert  and oriented  Airway & Oxygen Therapy: Patient Spontanous Breathing and Patient connected to face mask oxygen  Post-op Assessment: Report given to PACU RN, Post -op Vital signs reviewed and stable and Patient moving all extremities X 4  Post vital signs: Reviewed and stable  Complications: No apparent anesthesia complications

## 2012-11-16 NOTE — Op Note (Signed)
11/16/2012  12:36 PM  PATIENT:  Cameron Petty  56 y.o. male  PRE-OPERATIVE DIAGNOSIS:  perineal abscess  POST-OPERATIVE DIAGNOSIS:  perineal abscess  PROCEDURE:  Procedure(s) (LRB) with comments: INCISION AND DRAINAGE ABSCESS (N/A) - incision and drainage perineal abscess  SURGEON:  Surgeon(s) and Role:    * Robyne Askew, MD - Primary  PHYSICIAN ASSISTANT:   ASSISTANTS: none   ANESTHESIA:   general  EBL:     BLOOD ADMINISTERED:none  DRAINS: none   LOCAL MEDICATIONS USED:  MARCAINE     SPECIMEN:  No Specimen  DISPOSITION OF SPECIMEN:  N/A  COUNTS:  YES  TOURNIQUET:  * No tourniquets in log *  DICTATION: .Dragon Dictation After informed consent was obtained patient was brought to the operating room placed in the supine position on the operating room table. After adequate induction of general anesthesia the patient was moved in the lithotomy position. His right thigh and groin area were prepped with Betadine and draped in usual sterile fashion. The small abscess in the right inner upper thigh was probed with a hemostat. The small abscess cavity was unroofed with the electrocautery. A small amount of purulence was expressed. The granulation tissue in the cavity was destroyed with the electrocautery. Once this was accomplished the wound was wide open and small. The wound was infiltrated quarter percent Marcaine and packed with a moistened 4 x 4 gauze. Sterile dressings were applied. The patient tolerated the procedure well. At the end of the case all needle sponge and instrument counts were correct. The patient was then awakened and taken to recovery in stable condition.  PLAN OF CARE: Discharge to home after PACU  PATIENT DISPOSITION:  PACU - hemodynamically stable.   Delay start of Pharmacological VTE agent (>24hrs) due to surgical blood loss or risk of bleeding: not applicable

## 2012-11-16 NOTE — Anesthesia Procedure Notes (Signed)
Procedure Name: LMA Insertion Date/Time: 11/16/2012 12:18 PM Performed by: Sharlene Dory E Pre-anesthesia Checklist: Patient identified, Emergency Drugs available, Suction available, Patient being monitored and Timeout performed Patient Re-evaluated:Patient Re-evaluated prior to inductionOxygen Delivery Method: Circle system utilized Preoxygenation: Pre-oxygenation with 100% oxygen Intubation Type: IV induction LMA: LMA with gastric port inserted LMA Size: 4.0 Number of attempts: 1 Placement Confirmation: positive ETCO2 and breath sounds checked- equal and bilateral Tube secured with: Tape Dental Injury: Teeth and Oropharynx as per pre-operative assessment

## 2012-11-16 NOTE — Anesthesia Postprocedure Evaluation (Signed)
Anesthesia Post Note  Patient: Cameron Petty  Procedure(s) Performed: Procedure(s) (LRB): INCISION AND DRAINAGE ABSCESS (N/A)  Anesthesia type: General  Patient location: PACU  Post pain: Pain level controlled  Post assessment: Patient's Cardiovascular Status Stable  Last Vitals:  Filed Vitals:   11/16/12 1330  BP: 122/72  Pulse: 84  Temp:   Resp: 16    Post vital signs: Reviewed and stable  Level of consciousness: alert  Complications: No apparent anesthesia complications

## 2012-11-16 NOTE — Interval H&P Note (Signed)
History and Physical Interval Note:  11/16/2012 11:01 AM  Cameron Petty  has presented today for surgery, with the diagnosis of perineal abscess  The various methods of treatment have been discussed with the patient and family. After consideration of risks, benefits and other options for treatment, the patient has consented to  Procedure(s) (LRB) with comments: INCISION AND DRAINAGE ABSCESS (N/A) - incision and drainage perineal abscess as a surgical intervention .  The patient's history has been reviewed, patient examined, no change in status, stable for surgery.  I have reviewed the patient's chart and labs.  Questions were answered to the patient's satisfaction.     TOTH III,PAUL S

## 2012-11-16 NOTE — H&P (View-Only) (Signed)
Subjective:     Patient ID: Cameron Petty, male   DOB: 06/30/1956, 56 y.o.   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 He has been gradually improving but today he notes worsening pain in the right peroneal and inner thigh area. He denies any fevers or drainage. His bowels have been moving regularly.  Review of Systems  Constitutional: Negative.   HENT: Negative.   Eyes: Negative.   Respiratory: Negative.   Cardiovascular: Negative.   Gastrointestinal: Negative.   Genitourinary: Negative.   Musculoskeletal: Negative.   Skin: Negative.   Neurological: Negative.   Hematological: Negative.   Psychiatric/Behavioral: Negative.        Objective:   Physical Exam  Constitutional: He is oriented to person, place, and time. He appears well-developed and well-nourished.  HENT:  Head: Normocephalic and atraumatic.  Eyes: Conjunctivae normal and EOM are normal. Pupils are equal, round, and reactive to light.  Neck: Normal range of motion. Neck supple.  Cardiovascular: Normal rate, regular rhythm and normal heart sounds.   Pulmonary/Chest: Effort normal and breath sounds normal.  Abdominal: Soft. Bowel sounds are normal.  Genitourinary:       The seton is in place and the perirectal skin looks good. In the right peroneal and inner thigh area he has a new small abscess. He would not allow me to incise and drain it today in the clinic.  Musculoskeletal: Normal range of motion.  Neurological: He is alert and oriented to person, place, and time.  Skin: Skin is warm and dry.  Psychiatric: He has a normal mood and affect. His behavior is normal.       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 This area is clean and doing well. He also has a new right perineal inner greater needle thigh abscess. This area will need to be drained. I've  discussed with him in detail the risks and benefits of the operation to do this as well as some of the technical aspects and he understands and wishes to proceed. He would like to try to wait until after Thanksgiving. He understands that if he cannot make it through a surgery date he'll have to go through the emergency department.    Plan:     Plan to incise and drain the new right perineal abscess      

## 2012-11-16 NOTE — Anesthesia Preprocedure Evaluation (Signed)
Anesthesia Evaluation  Patient identified by MRN, date of birth, ID band Patient awake    Reviewed: Allergy & Precautions, H&P , NPO status , Patient's Chart, lab work & pertinent test results, reviewed documented beta blocker date and time   Airway Mallampati: II TM Distance: >3 FB Neck ROM: full    Dental   Pulmonary neg pulmonary ROS, Current Smoker,  breath sounds clear to auscultation        Cardiovascular hypertension, On Medications and On Home Beta Blockers + CAD, + Past MI, + Cardiac Stents and + Peripheral Vascular Disease negative cardio ROS  Rhythm:regular     Neuro/Psych PSYCHIATRIC DISORDERS CVA, Residual Symptoms    GI/Hepatic negative GI ROS, Neg liver ROS,   Endo/Other  negative endocrine ROS  Renal/GU negative Renal ROS  negative genitourinary   Musculoskeletal   Abdominal   Peds  Hematology negative hematology ROS (+)   Anesthesia Other Findings See surgeon's H&P   Reproductive/Obstetrics negative OB ROS                           Anesthesia Physical Anesthesia Plan  ASA: III  Anesthesia Plan: General   Post-op Pain Management:    Induction: Intravenous  Airway Management Planned: LMA  Additional Equipment:   Intra-op Plan:   Post-operative Plan: Extubation in OR  Informed Consent: I have reviewed the patients History and Physical, chart, labs and discussed the procedure including the risks, benefits and alternatives for the proposed anesthesia with the patient or authorized representative who has indicated his/her understanding and acceptance.   Dental Advisory Given  Plan Discussed with: CRNA and Surgeon  Anesthesia Plan Comments:         Anesthesia Quick Evaluation

## 2012-11-16 NOTE — Preoperative (Signed)
Beta Blockers   Reason not to administer Beta Blockers:Not Applicable 

## 2012-11-17 ENCOUNTER — Encounter (HOSPITAL_COMMUNITY): Payer: Self-pay | Admitting: General Surgery

## 2012-12-15 ENCOUNTER — Encounter (INDEPENDENT_AMBULATORY_CARE_PROVIDER_SITE_OTHER): Payer: Medicaid Other | Admitting: General Surgery

## 2012-12-18 ENCOUNTER — Encounter (INDEPENDENT_AMBULATORY_CARE_PROVIDER_SITE_OTHER): Payer: Medicaid Other | Admitting: General Surgery

## 2013-01-01 ENCOUNTER — Encounter (HOSPITAL_COMMUNITY): Payer: Self-pay | Admitting: Emergency Medicine

## 2013-01-01 ENCOUNTER — Emergency Department (HOSPITAL_COMMUNITY)
Admission: EM | Admit: 2013-01-01 | Discharge: 2013-01-01 | Disposition: A | Discharge status: Home / Self Care | Payer: Medicaid Other | Attending: Emergency Medicine | Admitting: Emergency Medicine

## 2013-01-01 DIAGNOSIS — Z7901 Long term (current) use of anticoagulants: Secondary | ICD-10-CM | POA: Insufficient documentation

## 2013-01-01 DIAGNOSIS — Z79899 Other long term (current) drug therapy: Secondary | ICD-10-CM | POA: Insufficient documentation

## 2013-01-01 DIAGNOSIS — Z8673 Personal history of transient ischemic attack (TIA), and cerebral infarction without residual deficits: Secondary | ICD-10-CM | POA: Insufficient documentation

## 2013-01-01 DIAGNOSIS — I10 Essential (primary) hypertension: Secondary | ICD-10-CM | POA: Insufficient documentation

## 2013-01-01 DIAGNOSIS — F17200 Nicotine dependence, unspecified, uncomplicated: Secondary | ICD-10-CM | POA: Insufficient documentation

## 2013-01-01 DIAGNOSIS — L299 Pruritus, unspecified: Secondary | ICD-10-CM

## 2013-01-01 DIAGNOSIS — R21 Rash and other nonspecific skin eruption: Secondary | ICD-10-CM

## 2013-01-01 DIAGNOSIS — I252 Old myocardial infarction: Secondary | ICD-10-CM | POA: Insufficient documentation

## 2013-01-01 DIAGNOSIS — Z7982 Long term (current) use of aspirin: Secondary | ICD-10-CM | POA: Insufficient documentation

## 2013-01-01 MED ORDER — DEXAMETHASONE SODIUM PHOSPHATE 10 MG/ML IJ SOLN
10.0000 mg | Freq: Once | INTRAMUSCULAR | Status: AC
  Administered 2013-01-01: 10 mg via INTRAMUSCULAR
  Filled 2013-01-01: qty 1

## 2013-01-01 MED ORDER — HYDROCORTISONE 1 % EX LOTN: TOPICAL_LOTION | Freq: Two times a day (BID) | CUTANEOUS | Status: DC

## 2013-01-01 MED ORDER — PREDNISONE 20 MG PO TABS: ORAL_TABLET | ORAL | Status: DC

## 2013-01-01 NOTE — ED Notes (Signed)
Pt has had itchy burning rash  Under left arm and on back and arms x over 6 month

## 2013-01-01 NOTE — ED Notes (Signed)
Pt c/o generalized rash x several months that is itching and burning; pt sts hx of I/D or wounds in past

## 2013-01-04 NOTE — ED Provider Notes (Signed)
History     CSN: [id number]  Arrival date & time 01/01/13  7829   First MD Initiated Contact with Patient 01/01/13 1251      Chief Complaint  Patient presents with  . Rash    (Consider location/radiation/quality/duration/timing/severity/associated sxs/prior treatment) HPI Comments:      Patient is a 57 y.o. male presenting with rash. The history is provided by medical records and the patient. No language interpreter was used.  Rash  This is a chronic problem. Episode onset: several months. The problem has not changed since onset.Associated with: unknown. There has been no fever. Affected Location: generalized and involves face, scalp ,groin, no palmar/plantar involvement. Associated symptoms include itching. Pertinent negatives include no blisters, no pain and no weeping. He has tried antihistamines and anti-itch cream for the symptoms. The treatment provided mild relief.  Followed by Dr. Sharyn Lull. Denies contacts with similar,changes in lotions/soaps/detergents,medication change, exposure to animal or plant irritants, and denies purulent discharge. Denies fevers, chills, myalgias, arthralgias. Denies DOE, SOB, chest tightness or pressure, radiation to left arm, jaw or back, or diaphoresis. Denies dysuria, flank pain, suprapubic pain, frequency, urgency, or hematuria. Denies headaches, light headedness, weakness, visual disturbances. Denies abdominal pain, nausea, vomiting, diarrhea or constipation.  Past Medical History  Diagnosis Date  . Hypertension   . Stroke 2007    mini stroke, no residule now  . Myocardial infarction 2007    Past Surgical History  Procedure Date  . Gallbladder surg in 2012   . Incision and drainage perirectal abscess 08/11/2012    Procedure: IRRIGATION AND DEBRIDEMENT PERIRECTAL ABSCESS;  Surgeon: Robyne Askew, MD;  Location: [facility] OR;  Service: General;  Laterality: N/A;  . Cardiac catheterization 2007    stents placed  . Incision and drainage abscess  11/16/2012    Procedure: INCISION AND DRAINAGE ABSCESS;  Surgeon: Robyne Askew, MD;  Location: [facility] OR;  Service: General;  Laterality: N/A;  incision and drainage perineal abscess    History reviewed. No pertinent family history.  History  Substance Use Topics  . Smoking status: Current Every Day Smoker -- 0.5 packs/day for 30 years    Last Attempt to Quit: 05/16/2012  . Smokeless tobacco: Not on file  . Alcohol Use: Yes     Comment: occasionally      Review of Systems  Constitutional: Negative.   HENT: Negative.   Eyes: Negative.   Respiratory: Negative.   Cardiovascular: Negative.   Gastrointestinal: Negative.   Genitourinary: Negative.   Musculoskeletal: Negative.   Skin: Positive for itching and rash.  Neurological: Negative.   Hematological: Negative.   Psychiatric/Behavioral: Negative.   All other systems reviewed and are negative.    Allergies  Penicillins  Home Medications   Current Outpatient Rx  Name  Route  Sig  Dispense  Refill  . ASPIRIN EC 81 MG PO TBEC   Oral   Take 81 mg by mouth daily.         . ATORVASTATIN CALCIUM 40 MG PO TABS   Oral   Take 40 mg by mouth daily.         Marland Kitchen CARVEDILOL 3.125 MG PO TABS   Oral   Take 3.125 mg by mouth 2 (two) times daily with a meal.         . CLOPIDOGREL BISULFATE 75 MG PO TABS   Oral   Take 75 mg by mouth daily.         Marland Kitchen HYDROCORTISONE 1 % EX LOTN  Topical   Apply topically 2 (two) times daily.   118 mL   0   . LISINOPRIL 10 MG PO TABS   Oral   Take 10 mg by mouth daily.         Marland Kitchen PREDNISONE 20 MG PO TABS      3 tabs po day one, then 2 po daily x 4 days   11 tablet   0     BP 160/83  Pulse 103  Temp 98.2 F (36.8 C) (Oral)  Resp 20  SpO2 100%  Physical Exam  Nursing note and vitals reviewed. Constitutional: He is oriented to person, place, and time. He appears well-developed and well-nourished. No distress.       Appears older than stated age.  HENT:  Head:  Normocephalic and atraumatic.  Eyes: Conjunctivae normal are normal. No scleral icterus.  Neck: Normal range of motion. Neck supple.  Cardiovascular: Normal rate, regular rhythm and normal heart sounds.   Pulmonary/Chest: Effort normal and breath sounds normal. No respiratory distress.  Abdominal: Soft. He exhibits no distension and no mass. There is no tenderness. There is no guarding.  Musculoskeletal: He exhibits no edema.  Neurological: He is alert and oriented to person, place, and time.  Skin: Skin is warm and dry. Rash noted. Rash is urticarial. He is not diaphoretic.       No signs of infection, weeping, oozing .  Psychiatric: His behavior is normal.    ED Course  Procedures (including critical care time)  Labs Reviewed - No data to display No results found.   1. Rash   2. Pruritus       MDM  Patient with systemic rash.  I have discussed the need for the patient to f/u with Dr. Sharyn Lull to review his medications. He may also need follow up with a dermatologist.  WIll d/c with short course of steroid, atarax and topical treatment. Discussed that patient should not apply to his face. Discussed reasons to seek immediate care. Patient expresses understanding and agrees with plan.       Arthor Captain, PA-C 01/04/13 1458

## 2013-01-04 NOTE — ED Provider Notes (Signed)
Medical screening examination/treatment/procedure(s) were performed by non-physician practitioner and as supervising physician I was immediately available for consultation/collaboration.   Suheyb Raucci L Damiean Lukes, MD 01/04/13 1516 

## 2013-01-05 ENCOUNTER — Emergency Department (HOSPITAL_COMMUNITY): Payer: Medicaid Other

## 2013-01-05 ENCOUNTER — Encounter (HOSPITAL_COMMUNITY): Payer: Self-pay | Admitting: Family Medicine

## 2013-01-05 ENCOUNTER — Emergency Department (HOSPITAL_COMMUNITY)
Admission: EM | Admit: 2013-01-05 | Discharge: 2013-01-06 | Disposition: A | Discharge status: Home / Self Care | Payer: Medicaid Other | Attending: Emergency Medicine | Admitting: Emergency Medicine

## 2013-01-05 DIAGNOSIS — I252 Old myocardial infarction: Secondary | ICD-10-CM | POA: Insufficient documentation

## 2013-01-05 DIAGNOSIS — F17200 Nicotine dependence, unspecified, uncomplicated: Secondary | ICD-10-CM | POA: Insufficient documentation

## 2013-01-05 DIAGNOSIS — Z79899 Other long term (current) drug therapy: Secondary | ICD-10-CM | POA: Insufficient documentation

## 2013-01-05 DIAGNOSIS — R0600 Dyspnea, unspecified: Secondary | ICD-10-CM

## 2013-01-05 DIAGNOSIS — R071 Chest pain on breathing: Secondary | ICD-10-CM | POA: Insufficient documentation

## 2013-01-05 DIAGNOSIS — I10 Essential (primary) hypertension: Secondary | ICD-10-CM | POA: Insufficient documentation

## 2013-01-05 DIAGNOSIS — R0609 Other forms of dyspnea: Secondary | ICD-10-CM | POA: Insufficient documentation

## 2013-01-05 DIAGNOSIS — Z9889 Other specified postprocedural states: Secondary | ICD-10-CM | POA: Insufficient documentation

## 2013-01-05 DIAGNOSIS — R079 Chest pain, unspecified: Secondary | ICD-10-CM

## 2013-01-05 DIAGNOSIS — R0989 Other specified symptoms and signs involving the circulatory and respiratory systems: Secondary | ICD-10-CM | POA: Insufficient documentation

## 2013-01-05 DIAGNOSIS — Z7982 Long term (current) use of aspirin: Secondary | ICD-10-CM | POA: Insufficient documentation

## 2013-01-05 DIAGNOSIS — Z8673 Personal history of transient ischemic attack (TIA), and cerebral infarction without residual deficits: Secondary | ICD-10-CM | POA: Insufficient documentation

## 2013-01-05 LAB — COMPREHENSIVE METABOLIC PANEL
AST: 27 U/L (ref 0–37)
CO2: 25 mEq/L (ref 19–32)
Calcium: 9.2 mg/dL (ref 8.4–10.5)
Creatinine, Ser: 1.09 mg/dL (ref 0.50–1.35)
GFR calc non Af Amer: 74 mL/min — ABNORMAL LOW (ref >90)

## 2013-01-05 LAB — CBC WITH DIFFERENTIAL/PLATELET
Basophils Absolute: 0 10*3/uL (ref 0.0–0.1)
Basophils Relative: 0 % (ref 0–1)
Eosinophils Absolute: 0.2 K/uL (ref 0.0–0.7)
Eosinophils Relative: 1 % (ref 0–5)
HCT: 39.7 % (ref 39.0–52.0)
Hemoglobin: 13.1 g/dL (ref 13.0–17.0)
Lymphocytes Relative: 5 % — ABNORMAL LOW (ref 12–46)
Lymphs Abs: 0.7 K/uL (ref 0.7–4.0)
MCH: 27.7 pg (ref 26.0–34.0)
MCHC: 33 g/dL (ref 30.0–36.0)
MCV: 83.9 fL (ref 78.0–100.0)
Monocytes Absolute: 1.6 10*3/uL — ABNORMAL HIGH (ref 0.1–1.0)
Monocytes Relative: 12 % (ref 3–12)
Neutro Abs: 11.4 K/uL — ABNORMAL HIGH (ref 1.7–7.7)
Neutrophils Relative %: 81 % — ABNORMAL HIGH (ref 43–77)
Platelets: 371 K/uL (ref 150–400)
RBC: 4.73 MIL/uL (ref 4.22–5.81)
RDW: 15 % (ref 11.5–15.5)
WBC: 14 K/uL — ABNORMAL HIGH (ref 4.0–10.5)

## 2013-01-05 LAB — COMPREHENSIVE METABOLIC PANEL WITH GFR
ALT: 36 U/L (ref 0–53)
Albumin: 3.4 g/dL — ABNORMAL LOW (ref 3.5–5.2)
Alkaline Phosphatase: 105 U/L (ref 39–117)
BUN: 10 mg/dL (ref 6–23)
Chloride: 100 meq/L (ref 96–112)
GFR calc Af Amer: 85 mL/min — ABNORMAL LOW (ref >90)
Glucose, Bld: 97 mg/dL (ref 70–99)
Potassium: 3.9 meq/L (ref 3.5–5.1)
Sodium: 137 meq/L (ref 135–145)
Total Bilirubin: 0.3 mg/dL (ref 0.3–1.2)
Total Protein: 8.2 g/dL (ref 6.0–8.3)

## 2013-01-05 LAB — POCT I-STAT TROPONIN I
Troponin i, poc: 0 ng/mL (ref 0.00–0.08)
Troponin i, poc: 0 ng/mL (ref 0.00–0.08)

## 2013-01-05 MED ORDER — ASPIRIN 81 MG PO CHEW
324.0000 mg | CHEWABLE_TABLET | Freq: Once | ORAL | Status: AC
  Administered 2013-01-05: 324 mg via ORAL
  Filled 2013-01-05: qty 4

## 2013-01-05 MED ORDER — ONDANSETRON HCL 4 MG/2ML IJ SOLN
4.0000 mg | Freq: Once | INTRAMUSCULAR | Status: AC
  Administered 2013-01-05: 4 mg via INTRAVENOUS
  Filled 2013-01-05: qty 2

## 2013-01-05 MED ORDER — ONDANSETRON HCL 4 MG/2ML IJ SOLN
4.0000 mg | Freq: Once | INTRAMUSCULAR | Status: AC
  Administered 2013-01-05: 4 mg via INTRAVENOUS
  Filled 2013-01-05 (×2): qty 2

## 2013-01-05 MED ORDER — MORPHINE SULFATE 4 MG/ML IJ SOLN
4.0000 mg | Freq: Once | INTRAMUSCULAR | Status: AC
  Administered 2013-01-05: 4 mg via INTRAVENOUS
  Filled 2013-01-05: qty 1

## 2013-01-05 MED ORDER — IOHEXOL 350 MG/ML SOLN
100.0000 mL | Freq: Once | INTRAVENOUS | Status: AC | PRN
  Administered 2013-01-05: 100 mL via INTRAVENOUS

## 2013-01-05 NOTE — ED Provider Notes (Signed)
History     CSN: [id number]  Arrival date & time 01/05/13  1737   First MD Initiated Contact with Patient 01/05/13 2004      Chief Complaint  Patient presents with  . Chest Pain    (Consider location/radiation/quality/duration/timing/severity/associated sxs/prior treatment) HPI  Patient possess the emergency department with complaints of chest pain, shortness of breath, bilateral rib pain for the past few days. He has had a very light cough that resolved a few days ago. He takes blood pressure medications and has been out of it for the past 5 days. The patient said the pain started acutely this afternoon and has been persistent and severe. The patient is tachycardic and denies having pain like this in the past. He had surgery 1 month ago on a perneal abscess. He says that it still causes mild pain but that he is doing much better and does not have any complaints regarding that issue. His PMH is positive for hypertension, and he had a stroke and MI in 2007.   Past Medical History  Diagnosis Date  . Hypertension   . Stroke 2007    mini stroke, no residule now  . Myocardial infarction 2007    Past Surgical History  Procedure Date  . Gallbladder surg in 2012   . Incision and drainage perirectal abscess 08/11/2012    Procedure: IRRIGATION AND DEBRIDEMENT PERIRECTAL ABSCESS;  Surgeon: Robyne Askew, MD;  Location: [facility] OR;  Service: General;  Laterality: N/A;  . Cardiac catheterization 2007    stents placed  . Incision and drainage abscess 11/16/2012    Procedure: INCISION AND DRAINAGE ABSCESS;  Surgeon: Robyne Askew, MD;  Location: [facility] OR;  Service: General;  Laterality: N/A;  incision and drainage perineal abscess    History reviewed. No pertinent family history.  History  Substance Use Topics  . Smoking status: Current Every Day Smoker -- 0.5 packs/day for 30 years    Last Attempt to Quit: 05/16/2012  . Smokeless tobacco: Not on file  . Alcohol Use: Yes     Comment:  occasionally      Review of Systems  All other systems reviewed and are negative.    Allergies  Penicillins  Home Medications   Current Outpatient Rx  Name  Route  Sig  Dispense  Refill  . ASPIRIN EC 81 MG PO TBEC   Oral   Take 81 mg by mouth daily.         . ATORVASTATIN CALCIUM 40 MG PO TABS   Oral   Take 40 mg by mouth daily.         Marland Kitchen CARVEDILOL 3.125 MG PO TABS   Oral   Take 3.125 mg by mouth 2 (two) times daily with a meal.         . CLOPIDOGREL BISULFATE 75 MG PO TABS   Oral   Take 75 mg by mouth daily.         Marland Kitchen HYDROCODONE-ACETAMINOPHEN 5-325 MG PO TABS   Oral   Take 1-2 tablets by mouth every 6 (six) hours as needed. For pain         . HYDROCORTISONE 1 % EX LOTN   Topical   Apply 1 application topically 2 (two) times daily.         Marland Kitchen LISINOPRIL 10 MG PO TABS   Oral   Take 10 mg by mouth daily.         Marland Kitchen PREDNISONE 20 MG PO TABS  Oral   Take 40-60 mg by mouth See admin instructions. Take 60mg  on day 1, then 40mg  daily x 4 days           BP 143/82  Pulse 88  Temp 98.5 F (36.9 C) (Oral)  Resp 18  SpO2 96%  Physical Exam  Nursing note and vitals reviewed. Constitutional: He appears well-developed and well-nourished. He appears distressed (pt in mild distress due to pain).  HENT:  Head: Normocephalic and atraumatic.  Eyes: Pupils are equal, round, and reactive to light.  Neck: Normal range of motion. Neck supple.  Cardiovascular: Normal rate.   Pulmonary/Chest: Effort normal. No respiratory distress. He has no wheezes. He has no rales.  Abdominal: Soft.  Neurological: He is alert.  Skin: Skin is warm and dry.    ED Course  Procedures (including critical care time)  Labs Reviewed  CBC WITH DIFFERENTIAL - Abnormal; Notable for the following:    WBC 14.0 (*)     Neutrophils Relative 81 (*)     Neutro Abs 11.4 (*)     Lymphocytes Relative 5 (*)     Monocytes Absolute 1.6 (*)     All other components within normal  limits  COMPREHENSIVE METABOLIC PANEL - Abnormal; Notable for the following:    Albumin 3.4 (*)     GFR calc non Af Amer 74 (*)     GFR calc Af Amer 85 (*)     All other components within normal limits  POCT I-STAT TROPONIN I  POCT I-STAT TROPONIN I  LIPASE, BLOOD   Dg Chest 2 View  01/05/2013  *RADIOLOGY REPORT*  Clinical Data: Body aches and chest pain.  Shortness of breath.  CHEST - 2 VIEW  Comparison: Chest x-ray 11/16/2012.  Findings: Lung volumes are normal.  No consolidative airspace disease.  No pleural effusions.  No pneumothorax.  No pulmonary nodule or mass noted.  Pulmonary vasculature and the cardiomediastinal silhouette are within normal limits.  IMPRESSION: 1. No radiographic evidence of acute cardiopulmonary disease.   Original Report Authenticated By: Trudie Reed, M.D.    Ct Angio Chest W/cm &/or Wo Cm  01/05/2013  *RADIOLOGY REPORT*  Clinical Data: Chest pain, tachycardia, shortness of breath.  CT ANGIOGRAPHY CHEST  Technique:  Multidetector CT imaging of the chest using the standard protocol during bolus administration of intravenous contrast. Multiplanar reconstructed images including MIPs were obtained and reviewed to evaluate the vascular anatomy.  Contrast: OMNIPAQUE IOHEXOL 350 MG/ML SOLN  Comparison: None.  Findings: No filling defects in the pulmonary arteries to suggest pulmonary emboli. Lungs are clear.  No focal airspace opacities or suspicious nodules.  No effusions.  Heart is normal size.  Aorta is normal caliber. Suggestion of calcification within the left circumflex artery.  Small scattered mediastinal and hilar lymph nodes, none pathologically enlarged.  Small bilateral axillary lymph nodes, none pathologically enlarged. Visualized thyroid and chest wall soft tissues unremarkable. Imaging into the upper abdomen shows no acute findings.  IMPRESSION: No evidence of pulmonary embolus.  Suggestion of calcifications in the left circumflex coronary artery.  No  acute findings.   Original Report Authenticated By: Charlett Nose, M.D.      1. Dyspnea   2. Chest pain       MDM  CT angio ordered to ro PE. Aspirin 325 mg and 4mg  Morphine also given. First Troponin has resulted negative. Delta trop ordered.   Pt has had significant relief from the Morphine. Still complains of SOB, oxygen saturation is  100%     12:30am- pts first and delta Troponin are negative. Pts CT angio of the chest negative. Around 11:30pm the patient informed me that he stretched and then immediately all of his discomfort went away. I have monitored him for 1.5 hours and he remains in his room calm, comfortable and lounging with vitals WNL. He says that feels as though he is ready to go and is having no symptoms. I recommended admittance for cycling his enzymes and observation over night. He chooses to go home and not stay. Since his work up has been negative and he is not having any symptoms, I will not force him to sign out AMA. Pt has mild cough in exam room. He has been given STRICT return to ED precuations.  Will give albuterol inhaler and for home. His son picked up his BP medications today.   Pt has been advised of the symptoms that warrant their return to the ED. Patient has voiced understanding and has agreed to follow-up with the PCP or specialist.    Dorthula Matas, PA 01/06/13 [phone]

## 2013-01-05 NOTE — ED Notes (Signed)
Per pt sts chest pain, SOB, rib pain, cough, for a few days. sts he hasn't had his medications in 5 days.

## 2013-01-06 MED ORDER — ALBUTEROL SULFATE HFA 108 (90 BASE) MCG/ACT IN AERS
2.0000 | INHALATION_SPRAY | RESPIRATORY_TRACT | Status: DC | PRN
  Administered 2013-01-06: 2 via RESPIRATORY_TRACT
  Filled 2013-01-06: qty 6.7

## 2013-01-06 NOTE — ED Provider Notes (Signed)
Medical screening examination/treatment/procedure(s) were performed by non-physician practitioner and as supervising physician I was immediately available for consultation/collaboration.   Gavin Pound. Oletta Lamas, MD 01/06/13 9528

## 2013-01-25 ENCOUNTER — Encounter (INDEPENDENT_AMBULATORY_CARE_PROVIDER_SITE_OTHER): Payer: Medicaid Other | Admitting: General Surgery

## 2013-02-06 ENCOUNTER — Telehealth (INDEPENDENT_AMBULATORY_CARE_PROVIDER_SITE_OTHER): Payer: Self-pay

## 2013-02-06 NOTE — Telephone Encounter (Signed)
Called pt to see if he could come in earlier. He will return call. I wanted him to come in at 9:30 and if he cant do that i was going to see if 1:30 is ok.

## 2013-02-07 ENCOUNTER — Encounter (INDEPENDENT_AMBULATORY_CARE_PROVIDER_SITE_OTHER): Payer: Self-pay | Admitting: General Surgery

## 2013-02-07 ENCOUNTER — Ambulatory Visit (INDEPENDENT_AMBULATORY_CARE_PROVIDER_SITE_OTHER): Payer: Medicaid Other | Admitting: General Surgery

## 2013-02-07 VITALS — BP 170/82 | HR 80 | Temp 97.1°F | Resp 16 | Ht 70.5 in | Wt 228.6 lb

## 2013-02-07 DIAGNOSIS — K603 Anal fistula: Secondary | ICD-10-CM

## 2013-02-07 NOTE — Patient Instructions (Signed)
See Dr. Sharyn Lull today for rash

## 2013-02-07 NOTE — Progress Notes (Signed)
Subjective:     Patient ID: Cameron Petty, male   DOB: 1956/04/03, 57 y.o.   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 He still complains of some soreness in his rectum. He denies any fevers or drainage. He also notes an increasing rash over the last few weeks. He is not sure which medicine is causing this.  Review of Systems     Objective:   Physical Exam On exam his perirectal skin looks good. The seton is still in place. We were able to tighten it up today without difficulty. 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 We have gotten him an appointment with his cardiologist for today to see if any of his cardiac meds could be causing his rash. We will plan to see him back in one month

## 2013-02-13 ENCOUNTER — Telehealth (INDEPENDENT_AMBULATORY_CARE_PROVIDER_SITE_OTHER): Payer: Self-pay

## 2013-02-13 NOTE — Telephone Encounter (Signed)
Called pt and told him I needed to move his appt from 3/3 to 3/4. She is now scheduled for 3/4 @ 9:10.

## 2013-02-26 ENCOUNTER — Encounter (INDEPENDENT_AMBULATORY_CARE_PROVIDER_SITE_OTHER): Payer: Medicaid Other | Admitting: General Surgery

## 2013-02-27 ENCOUNTER — Encounter (INDEPENDENT_AMBULATORY_CARE_PROVIDER_SITE_OTHER): Payer: Medicaid Other | Admitting: General Surgery

## 2013-03-01 ENCOUNTER — Encounter (HOSPITAL_COMMUNITY): Payer: Self-pay

## 2013-03-01 ENCOUNTER — Emergency Department (HOSPITAL_COMMUNITY)
Admission: EM | Admit: 2013-03-01 | Discharge: 2013-03-01 | Disposition: A | Discharge status: Home / Self Care | Payer: Medicaid Other | Attending: Emergency Medicine | Admitting: Emergency Medicine

## 2013-03-01 DIAGNOSIS — Z79899 Other long term (current) drug therapy: Secondary | ICD-10-CM | POA: Insufficient documentation

## 2013-03-01 DIAGNOSIS — F17200 Nicotine dependence, unspecified, uncomplicated: Secondary | ICD-10-CM | POA: Insufficient documentation

## 2013-03-01 DIAGNOSIS — L299 Pruritus, unspecified: Secondary | ICD-10-CM | POA: Insufficient documentation

## 2013-03-01 DIAGNOSIS — I252 Old myocardial infarction: Secondary | ICD-10-CM | POA: Insufficient documentation

## 2013-03-01 DIAGNOSIS — Z8673 Personal history of transient ischemic attack (TIA), and cerebral infarction without residual deficits: Secondary | ICD-10-CM | POA: Insufficient documentation

## 2013-03-01 DIAGNOSIS — Z7982 Long term (current) use of aspirin: Secondary | ICD-10-CM | POA: Insufficient documentation

## 2013-03-01 DIAGNOSIS — I10 Essential (primary) hypertension: Secondary | ICD-10-CM | POA: Insufficient documentation

## 2013-03-01 DIAGNOSIS — Z7902 Long term (current) use of antithrombotics/antiplatelets: Secondary | ICD-10-CM | POA: Insufficient documentation

## 2013-03-01 MED ORDER — DEXAMETHASONE SODIUM PHOSPHATE 10 MG/ML IJ SOLN
10.0000 mg | Freq: Once | INTRAMUSCULAR | Status: AC
  Administered 2013-03-01: 10 mg via INTRAMUSCULAR
  Filled 2013-03-01: qty 1

## 2013-03-01 MED ORDER — HYDROCORTISONE 1 % EX LOTN: TOPICAL_LOTION | Freq: Two times a day (BID) | CUTANEOUS | Status: DC

## 2013-03-01 NOTE — ED Notes (Signed)
Pt seen here 01/04/13 for rash. Was tx with Prednisone. States rash improved but has now returned. Has blisters to scalp, chest and back. States these are painful.

## 2013-03-01 NOTE — ED Provider Notes (Signed)
History    This chart was scribed for non-physician practitioner working with Cameron Razor, MD by [name], ED Scribe. This patient was seen in room TR05C/TR05C and the patient's care was started at 4:27PM.   CSN: [id number]  Arrival date & time 03/01/13  1401   First MD Initiated Contact with Patient 03/01/13 1627      No chief complaint on file.   (Consider location/radiation/quality/duration/timing/severity/associated sxs/prior treatment) Patient is a 57 y.o. male presenting with rash. The history is provided by the patient. No language interpreter was used.  Rash Location:  Torso, shoulder/arm, face and head/neck Head/neck rash location:  Scalp Facial rash location:  Chin Shoulder/arm rash location:  L shoulder and R shoulder Torso rash location:  Upper back Quality: blistering and itchiness   Severity:  Moderate Onset quality:  Gradual Duration:  7 days Timing:  Constant Progression:  Worsening Chronicity:  Recurrent Relieved by:  Nothing Worsened by:  Nothing tried Ineffective treatments: Prednisone and cortisone cream. Associated symptoms: no shortness of breath and no sore throat     Cameron Petty is a 57 y.o. male , with a hx of hypertension, stroke, MI, multiple myeloma, rash and allergy to penicillins, who presents to the Emergency Department with a chief complaint of rash, complaining of gradual, progressively worsening, rash, located at the trunk, bilateral upper extremities and head, onset seven days ago (02/22/13).  Associated symptoms include blisters and itching. The pt reports he has tried to establish a follow up appointment with a Dermatologist, however, he informs they do not accept medicaid. The pt has taken prednisone and applied cortisone cream which provide moderate relief of the rash.  The pt denies SOB and sore throat. Furthermore, the pt denies any hx of diabetes.   The pt is a current everyday smoker, in addition to drinking alcohol occasionally.     Past Medical History  Diagnosis Date  . Hypertension   . Stroke 2007    mini stroke, no residule now  . Myocardial infarction 2007    Past Surgical History  Procedure Laterality Date  . Gallbladder surg in 2012    . Incision and drainage perirectal abscess  08/11/2012    Procedure: IRRIGATION AND DEBRIDEMENT PERIRECTAL ABSCESS;  Surgeon: Robyne Askew, MD;  Location: [facility] OR;  Service: General;  Laterality: N/A;  . Cardiac catheterization  2007    stents placed  . Incision and drainage abscess  11/16/2012    Procedure: INCISION AND DRAINAGE ABSCESS;  Surgeon: Robyne Askew, MD;  Location: [facility] OR;  Service: General;  Laterality: N/A;  incision and drainage perineal abscess    Family History  Problem Relation Age of Onset  . Heart disease Brother   . Cancer Maternal Uncle     breast    History  Substance Use Topics  . Smoking status: Current Every Day Smoker -- 0.25 packs/day for 30 years    Last Attempt to Quit: 05/16/2012  . Smokeless tobacco: Not on file  . Alcohol Use: Yes     Comment: occasionally      Review of Systems  HENT: Negative for sore throat.   Respiratory: Negative for shortness of breath.   Skin: Positive for rash.  All other systems reviewed and are negative.    Allergies  Penicillins  Home Medications   Current Outpatient Rx  Name  Route  Sig  Dispense  Refill  . aspirin EC 81 MG tablet   Oral   Take 81 mg  by mouth daily.         Marland Kitchen atorvastatin (LIPITOR) 40 MG tablet   Oral   Take 40 mg by mouth daily.         . carvedilol (COREG) 3.125 MG tablet   Oral   Take 3.125 mg by mouth 2 (two) times daily with a meal.         . clopidogrel (PLAVIX) 75 MG tablet   Oral   Take 75 mg by mouth daily.         . diphenhydrAMINE (BENADRYL) 25 MG tablet   Oral   Take 25 mg by mouth every 6 (six) hours as needed for itching.         . hydrocortisone 1 % lotion   Topical   Apply 1 application topically 2 (two) times daily.          Marland Kitchen lisinopril (PRINIVIL,ZESTRIL) 10 MG tablet   Oral   Take 10 mg by mouth daily.           BP 151/85  Pulse 95  Temp(Src) 99.9 F (37.7 C) (Oral)  Resp 20  SpO2 97%  Physical Exam  Nursing note and vitals reviewed. Constitutional: He is oriented to person, place, and time. He appears well-developed and well-nourished. No distress.  HENT:  Head: Normocephalic and atraumatic.  Eyes: EOM are normal.  Neck: Neck supple. No tracheal deviation present.  Cardiovascular: Normal rate, regular rhythm and normal heart sounds.   No murmur heard. Pulmonary/Chest: Effort normal and breath sounds normal. No respiratory distress. He has no wheezes.  Musculoskeletal: Normal range of motion.  Neurological: He is alert and oriented to person, place, and time.  Skin: Skin is warm and dry. Rash noted. Rash is pustular and urticarial.  Pustular, urticarial rash to the trunk, extremities, and head. Appears to be eczema.   Psychiatric: He has a normal mood and affect. His behavior is normal.    ED Course  Procedures (including critical care time)  DIAGNOSTIC STUDIES: Oxygen Saturation is 97% on room air, normal by my interpretation.    COORDINATION OF CARE:   4:49 PM- Treatment plan discussed with patient. Pt agrees with treatment.     Labs Reviewed - No data to display No results found.   No diagnosis found.  Pruritic rash, likely eczema.  Decadron injection, hydrocortisone lotion.  Patient provided with information on the two medical practices accepting medicaid patients [facility], [facility]).   MDM    I personally performed the services described in this documentation, which was scribed in my presence. The recorded information has been reviewed and is accurate.         Jimmye Norman, NP 03/01/13 [phone]

## 2013-03-01 NOTE — ED Notes (Addendum)
Pt presents with 1 week h/o rash.  Pt reports rash began to his back and is now generalized.  Pt seen here for same in January, reports prednisone helped but did not take rash away.   Pt reports rash began as blisters, takes hot showers to bust blisters. Pt reports blisters in his mouth, reports after blisters bust, his skin sloughs off.   +itching.  Pt reports he was told his medication is interacting, but unsure of which one.  Pt has attempted to find primary care.

## 2013-03-05 NOTE — ED Provider Notes (Signed)
Medical screening examination/treatment/procedure(s) were performed by non-physician practitioner and as supervising physician I was immediately available for consultation/collaboration.  [name], MD 03/05/13 [phone]

## 2013-03-27 ENCOUNTER — Ambulatory Visit (INDEPENDENT_AMBULATORY_CARE_PROVIDER_SITE_OTHER): Payer: Medicaid Other | Admitting: General Surgery

## 2013-03-27 ENCOUNTER — Encounter (INDEPENDENT_AMBULATORY_CARE_PROVIDER_SITE_OTHER): Payer: Self-pay | Admitting: General Surgery

## 2013-03-27 VITALS — BP 150/72 | HR 76 | Temp 97.2°F | Resp 16 | Ht 70.5 in | Wt 243.0 lb

## 2013-03-27 DIAGNOSIS — K603 Anal fistula, unspecified: Secondary | ICD-10-CM

## 2013-03-27 DIAGNOSIS — K148 Other diseases of tongue: Secondary | ICD-10-CM | POA: Insufficient documentation

## 2013-03-27 NOTE — Patient Instructions (Signed)
Keep rectal area clean and dry

## 2013-04-11 ENCOUNTER — Emergency Department (HOSPITAL_COMMUNITY)
Admission: EM | Admit: 2013-04-11 | Discharge: 2013-04-11 | Disposition: A | Discharge status: Home / Self Care | Payer: Medicaid Other | Attending: Emergency Medicine | Admitting: Emergency Medicine

## 2013-04-11 ENCOUNTER — Encounter (HOSPITAL_COMMUNITY): Payer: Self-pay | Admitting: Family Medicine

## 2013-04-11 DIAGNOSIS — Z8673 Personal history of transient ischemic attack (TIA), and cerebral infarction without residual deficits: Secondary | ICD-10-CM | POA: Insufficient documentation

## 2013-04-11 DIAGNOSIS — Z7902 Long term (current) use of antithrombotics/antiplatelets: Secondary | ICD-10-CM | POA: Insufficient documentation

## 2013-04-11 DIAGNOSIS — H6091 Unspecified otitis externa, right ear: Secondary | ICD-10-CM

## 2013-04-11 DIAGNOSIS — F17200 Nicotine dependence, unspecified, uncomplicated: Secondary | ICD-10-CM | POA: Insufficient documentation

## 2013-04-11 DIAGNOSIS — I252 Old myocardial infarction: Secondary | ICD-10-CM | POA: Insufficient documentation

## 2013-04-11 DIAGNOSIS — H9190 Unspecified hearing loss, unspecified ear: Secondary | ICD-10-CM | POA: Insufficient documentation

## 2013-04-11 DIAGNOSIS — Z79899 Other long term (current) drug therapy: Secondary | ICD-10-CM | POA: Insufficient documentation

## 2013-04-11 DIAGNOSIS — Z7982 Long term (current) use of aspirin: Secondary | ICD-10-CM | POA: Insufficient documentation

## 2013-04-11 DIAGNOSIS — I10 Essential (primary) hypertension: Secondary | ICD-10-CM | POA: Insufficient documentation

## 2013-04-11 DIAGNOSIS — L309 Dermatitis, unspecified: Secondary | ICD-10-CM

## 2013-04-11 DIAGNOSIS — H60399 Other infective otitis externa, unspecified ear: Secondary | ICD-10-CM | POA: Insufficient documentation

## 2013-04-11 DIAGNOSIS — H9209 Otalgia, unspecified ear: Secondary | ICD-10-CM | POA: Insufficient documentation

## 2013-04-11 DIAGNOSIS — L259 Unspecified contact dermatitis, unspecified cause: Secondary | ICD-10-CM | POA: Insufficient documentation

## 2013-04-11 MED ORDER — DEXAMETHASONE SODIUM PHOSPHATE 10 MG/ML IJ SOLN
10.0000 mg | Freq: Once | INTRAMUSCULAR | Status: AC
  Administered 2013-04-11: 10 mg via INTRAMUSCULAR
  Filled 2013-04-11: qty 1

## 2013-04-11 MED ORDER — PREDNISONE 20 MG PO TABS: ORAL_TABLET | ORAL | Status: DC

## 2013-04-11 MED ORDER — CIPROFLOXACIN-DEXAMETHASONE 0.3-0.1 % OT SUSP
4.0000 [drp] | Freq: Two times a day (BID) | OTIC | Status: DC
  Administered 2013-04-11: 4 [drp] via OTIC
  Filled 2013-04-11: qty 7.5

## 2013-04-11 MED ORDER — HYDROCORTISONE 2.5 % EX LOTN
TOPICAL_LOTION | Freq: Two times a day (BID) | CUTANEOUS | Status: DC
Start: 2013-04-11 — End: 2013-05-05

## 2013-04-11 NOTE — ED Notes (Signed)
Pt here for the fourth time for this rash. States it is now causing his ears to swell and feel "stopped up". Pt tried to get in with the dermatologist but they will not take Medicaid. Tried to get in with [facility] but they said they were not taking new pts.

## 2013-04-11 NOTE — ED Notes (Signed)
Per pt sts rash that hasn't gotten any better. sts on face and spreading.

## 2013-04-11 NOTE — ED Provider Notes (Signed)
History    This chart was scribed for non-physician practitioner working with Laray Anger, DO by Leone Payor, ED Scribe. This patient was seen in room TR10C/TR10C and the patient's care was started at 1425.   CSN: [id number]  Arrival date & time 04/11/13  1425   None     Chief Complaint  Patient presents with  . Rash     Patient is a 57 y.o. male presenting with rash. The history is provided by the patient. No language interpreter was used.  Rash Location:  Torso, head/neck, face and shoulder/arm Head/neck rash location:  Head and L ear Facial rash location:  Face Shoulder/arm rash location:  L shoulder, R shoulder, L axilla and R axilla Torso rash location:  L axilla, R axilla, R chest, R flank, L flank, L chest, upper back and lower back Quality: dryness, itchiness and scaling   Quality: not blistering   Severity:  Severe Onset quality:  Gradual Duration:  2 months Timing:  Constant Progression:  Worsening Context: medications   Relieved by:  Anti-itch cream and topical steroids Worsened by:  Contact Associated symptoms: no abdominal pain, no diarrhea, no fatigue, no headaches, no nausea, not vomiting and not wheezing     Cameron Petty is a 57 y.o. male who presents to the Emergency Department complaining of an ongoing, gradually worsening, constant rash to the face, head, and trunk starting about 2 months ago when the dosage of his medication was increased. This change in medication at that time. He had great relief from prednisone when he was seen previously he's been treated for a "drug rash". Pt states the rash is very itchy and is spreading everywhere. Yesterday he noticed his R ear canal has begun to swell. He reports using [place] cream and the rash on his trunk with mild relief. He has tried to see a dermatologist but has been unable to get an appointment. Pt has h/o HTN, MI.   Past Medical History  Diagnosis Date  . Hypertension   . Stroke 2007    mini  stroke, no residule now  . Myocardial infarction 2007    Past Surgical History  Procedure Laterality Date  . Gallbladder surg in 2012    . Incision and drainage perirectal abscess  08/11/2012    Procedure: IRRIGATION AND DEBRIDEMENT PERIRECTAL ABSCESS;  Surgeon: Robyne Askew, MD;  Location: [facility] OR;  Service: General;  Laterality: N/A;  . Cardiac catheterization  2007    stents placed  . Incision and drainage abscess  11/16/2012    Procedure: INCISION AND DRAINAGE ABSCESS;  Surgeon: Robyne Askew, MD;  Location: [facility] OR;  Service: General;  Laterality: N/A;  incision and drainage perineal abscess    Family History  Problem Relation Age of Onset  . Heart disease Brother   . Cancer Maternal Uncle     breast    History  Substance Use Topics  . Smoking status: Current Every Day Smoker -- 0.25 packs/day for 30 years    Last Attempt to Quit: 05/16/2012  . Smokeless tobacco: Not on file  . Alcohol Use: Yes     Comment: occasionally      Review of Systems  Constitutional: Negative for diaphoresis, appetite change, fatigue and unexpected weight change.  HENT: Positive for hearing loss and ear pain. Negative for mouth sores and neck stiffness.   Eyes: Negative for visual disturbance.  Respiratory: Negative for chest tightness and wheezing.   Cardiovascular: Negative for  chest pain.  Gastrointestinal: Negative for nausea, vomiting, abdominal pain, diarrhea and constipation.  Endocrine: Negative for polydipsia, polyphagia and polyuria.  Genitourinary: Negative for dysuria, urgency, frequency and hematuria.  Musculoskeletal: Negative for back pain.  Skin: Positive for rash.  Allergic/Immunologic: Negative for immunocompromised state.  Neurological: Negative for syncope, light-headedness and headaches.  Hematological: Does not bruise/bleed easily.  Psychiatric/Behavioral: Negative for sleep disturbance. The patient is not nervous/anxious.     Allergies  Penicillins  Home  Medications   Current Outpatient Rx  Name  Route  Sig  Dispense  Refill  . aspirin EC 81 MG tablet   Oral   Take 81 mg by mouth daily.         Marland Kitchen atorvastatin (LIPITOR) 40 MG tablet   Oral   Take 40 mg by mouth daily.         . carvedilol (COREG) 3.125 MG tablet   Oral   Take 3.125 mg by mouth 2 (two) times daily with a meal.         . clopidogrel (PLAVIX) 75 MG tablet   Oral   Take 75 mg by mouth daily.         . diphenhydrAMINE (BENADRYL) 25 MG tablet   Oral   Take 25 mg by mouth every 6 (six) hours as needed for itching.         . hydrocortisone 1 % lotion   Topical   Apply 1 application topically 2 (two) times daily.         Marland Kitchen lisinopril (PRINIVIL,ZESTRIL) 10 MG tablet   Oral   Take 10 mg by mouth daily.         . hydrocortisone 2.5 % lotion   Topical   Apply topically 2 (two) times daily.   59 mL   0   . predniSONE (DELTASONE) 20 MG tablet      3 tabs po daily x 3 days, then 2 tabs x 3 days, then 1.5 tabs x 3 days, then 1 tab x 3 days, then 0.5 tabs x 3 days   27 tablet   0     BP 154/98  Pulse 86  Temp(Src) 98.4 F (36.9 C) (Oral)  Resp 16  SpO2 98%  Physical Exam  Nursing note and vitals reviewed. Constitutional: He is oriented to person, place, and time. He appears well-developed and well-nourished. No distress.  HENT:  Head: Normocephalic and atraumatic.  Right Ear: There is swelling and tenderness. No drainage. Decreased hearing is noted.  Left Ear: Tympanic membrane, external ear and ear canal normal.  Nose: Nose normal. No mucosal edema or rhinorrhea.  Mouth/Throat: Uvula is midline, oropharynx is clear and moist and mucous membranes are normal. Mucous membranes are not dry and not cyanotic. No oropharyngeal exudate, posterior oropharyngeal edema, posterior oropharyngeal erythema or tonsillar abscesses.  Right ear is tender to palpation, erythematous. Has a partially occluded ear canal. TM is not visible. No drainage. No pain to  palpation of TMJ or mastoid.  Patient with rash noted on head and face  Eyes: Conjunctivae and EOM are normal. Pupils are equal, round, and reactive to light. No scleral icterus.  Neck: Normal range of motion. Neck supple. No thyromegaly present.  Cardiovascular: Normal rate, regular rhythm, normal heart sounds and intact distal pulses.   No murmur heard. Pulmonary/Chest: Effort normal and breath sounds normal. No respiratory distress. He has no wheezes. He has no rales. He exhibits no tenderness.  Abdominal: Soft. Bowel sounds are normal. He  exhibits no distension and no mass. There is no tenderness. There is no rebound and no guarding.  Musculoskeletal: Normal range of motion. He exhibits no edema.  Lymphadenopathy:    He has no cervical adenopathy.  Neurological: He is alert and oriented to person, place, and time. He exhibits normal muscle tone. Coordination normal.  Speech is clear and goal oriented Moves extremities without ataxia  Skin: Skin is warm and dry. Rash noted. He is not diaphoretic. There is erythema.  Diffuse patches of darkened skin with mild scaling in addition to scattered papules and pustules over head, face, neck, trunk, worse in axilla. Papules are erythematous. The plaques are not.   Psychiatric: He has a normal mood and affect.    ED Course  Procedures (including critical care time) DIAGNOSTIC STUDIES: Oxygen Saturation is 99% on room air, normal by my interpretation.    COORDINATION OF CARE: 4:15 PM-Discussed treatment plan with pt at bedside and pt agreed to plan.    Labs Reviewed - No data to display No results found.   1. Dermatitis   2. Otitis externa, right       MDM  Jerritt R Schriver resents with dermatitis likely atopic however unclear whether or not this is contact or drug-related.  Patient given dexamethasone IM with resolution of his itching. We'll discharge home with hydrocortisone lotion and prednisone taper.  He's been instructed to take  Benadryl by mouth every 6 hours for itching.  Pt also presenting with otitis externa with secondary to the atopic dermatitis and scratching the left ear canal. Partial canal occlusion without visibility of the TM.  Pt afebrile in NAD. Exam non concerning for mastoiditis, cellulitis or malignant OE. Ear wick placed in ED and patient given Cipro otic solution. Advised primary care follow up in 2-3 days if no improvement with treatment or no complete resolution by 7 days. Also recommend dermatology followup for further evaluation of his rash.    I personally performed the services described in this documentation, which was scribed in my presence. The recorded information has been reviewed and is accurate.   Dahlia Client Orlandria Kissner, PA-C 04/11/13 1821

## 2013-04-12 NOTE — ED Provider Notes (Signed)
Medical screening examination/treatment/procedure(s) were performed by non-physician practitioner and as supervising physician I was immediately available for consultation/collaboration.   Laray Anger, DO 04/12/13 1512

## 2013-04-17 NOTE — Progress Notes (Signed)
Subjective:     Patient ID: Cameron Petty, male   DOB: 18-Sep-1956, 57 y.o.   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 He continues to have some soreness associated with the area. He denies any fevers or chills. He's had minimal drainage from the area. He occasionally notices a small spotting of blood with bowel movements.  Review of Systems     Objective:   Physical Exam On exam his perirectal scan looks good. The seton was in place. 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 We will plan to see him back in another month to check his progress

## 2013-05-05 ENCOUNTER — Emergency Department (HOSPITAL_COMMUNITY)
Admission: EM | Admit: 2013-05-05 | Discharge: 2013-05-05 | Disposition: A | Discharge status: Home / Self Care | Payer: Medicaid Other | Attending: Emergency Medicine | Admitting: Emergency Medicine

## 2013-05-05 ENCOUNTER — Encounter (HOSPITAL_COMMUNITY): Payer: Self-pay | Admitting: Physical Medicine and Rehabilitation

## 2013-05-05 DIAGNOSIS — Z8673 Personal history of transient ischemic attack (TIA), and cerebral infarction without residual deficits: Secondary | ICD-10-CM | POA: Insufficient documentation

## 2013-05-05 DIAGNOSIS — I10 Essential (primary) hypertension: Secondary | ICD-10-CM | POA: Insufficient documentation

## 2013-05-05 DIAGNOSIS — L259 Unspecified contact dermatitis, unspecified cause: Secondary | ICD-10-CM | POA: Insufficient documentation

## 2013-05-05 DIAGNOSIS — I252 Old myocardial infarction: Secondary | ICD-10-CM | POA: Insufficient documentation

## 2013-05-05 DIAGNOSIS — Z760 Encounter for issue of repeat prescription: Secondary | ICD-10-CM | POA: Insufficient documentation

## 2013-05-05 DIAGNOSIS — L309 Dermatitis, unspecified: Secondary | ICD-10-CM

## 2013-05-05 DIAGNOSIS — Z7982 Long term (current) use of aspirin: Secondary | ICD-10-CM | POA: Insufficient documentation

## 2013-05-05 DIAGNOSIS — Z79899 Other long term (current) drug therapy: Secondary | ICD-10-CM | POA: Insufficient documentation

## 2013-05-05 DIAGNOSIS — F17200 Nicotine dependence, unspecified, uncomplicated: Secondary | ICD-10-CM | POA: Insufficient documentation

## 2013-05-05 MED ORDER — DIPHENHYDRAMINE HCL 25 MG PO TABS: 25.0000 mg | ORAL_TABLET | Freq: Four times a day (QID) | ORAL | Status: DC | PRN

## 2013-05-05 MED ORDER — HYDROCORTISONE 2.5 % EX LOTN
TOPICAL_LOTION | Freq: Two times a day (BID) | CUTANEOUS | Status: DC
Start: 2013-05-05 — End: 2013-07-11

## 2013-05-05 MED ORDER — PREDNISONE 10 MG PO TABS: ORAL_TABLET | ORAL | Status: DC

## 2013-05-05 MED ORDER — DEXAMETHASONE SODIUM PHOSPHATE 10 MG/ML IJ SOLN
10.0000 mg | Freq: Once | INTRAMUSCULAR | Status: AC
  Administered 2013-05-05: 10 mg via INTRAMUSCULAR
  Filled 2013-05-05: qty 1

## 2013-05-05 NOTE — ED Provider Notes (Signed)
History    This chart was scribed for non-physician practitioner working with Carleene Cooper III, MD by [name], ED Scribe. This patient was seen in room TR07C/TR07C and the patient's care was started at 6:57Pm.   CSN: [id number]  Arrival date & time 05/05/13  1708   First MD Initiated Contact with Patient 05/05/13 1857      Chief Complaint  Patient presents with  . Medication Refill  . Rash    (Consider location/radiation/quality/duration/timing/severity/associated sxs/prior treatment) Patient is a 57 y.o. male presenting with rash. The history is provided by the patient. No language interpreter was used.  Rash Location:  Torso Torso rash location:  Lower back and upper back Quality: draining, itchiness and redness   Severity:  Moderate Onset quality:  Unable to specify Timing:  Constant Progression:  Worsening Chronicity:  Chronic Relieved by:  Nothing Worsened by:  Nothing tried Ineffective treatments:  None tried Associated symptoms: no diarrhea, no fever, no headaches, no nausea, no shortness of breath and not vomiting     Cameron Petty is a 57 y.o. male , with a hx of rash, hypertension, stroke, MI, and cardiac catheterization, who presents to the Emergency Department complaining of medication refill, onset today (05/05/13). The pt reports he has been experiencing a gradual, progressively worsening, rash, located at the trunk, bilateral upper extremities and head, that began the end of February 2014. Associated symptoms include blisters and itching. However, he has been unable to acquire a dermatologist since his previous evaluation on 04/11/13 and has run out of medication prescribed to him at this ED which did bring relief of his sx. Pt states that he is very close to having this appointment and acquiring a PCP. He is here today requesting a refill of this medication.   The pt denies sob and difficulty breathing.   The pt is a current everyday smoker, in addition to  drinking alcohol occasionally.   Surgeon is Dr. Carolynne Edouard (with whom the pt is scheduled to visit with on 05/08/13). Cardiologist is Dr. Francia Greaves.    Past Medical History  Diagnosis Date  . Hypertension   . Stroke 2007    mini stroke, no residule now  . Myocardial infarction 2007    Past Surgical History  Procedure Laterality Date  . Gallbladder surg in 2012    . Incision and drainage perirectal abscess  08/11/2012    Procedure: IRRIGATION AND DEBRIDEMENT PERIRECTAL ABSCESS;  Surgeon: Robyne Askew, MD;  Location: [facility] OR;  Service: General;  Laterality: N/A;  . Cardiac catheterization  2007    stents placed  . Incision and drainage abscess  11/16/2012    Procedure: INCISION AND DRAINAGE ABSCESS;  Surgeon: Robyne Askew, MD;  Location: [facility] OR;  Service: General;  Laterality: N/A;  incision and drainage perineal abscess    Family History  Problem Relation Age of Onset  . Heart disease Brother   . Cancer Maternal Uncle     breast    History  Substance Use Topics  . Smoking status: Current Every Day Smoker -- 0.25 packs/day for 30 years    Last Attempt to Quit: 05/16/2012  . Smokeless tobacco: Not on file  . Alcohol Use: Yes     Comment: occasionally      Review of Systems  Constitutional: Negative for fever and diaphoresis.  HENT: Negative for neck pain and neck stiffness.   Eyes: Negative for visual disturbance.  Respiratory: Negative for apnea, chest tightness and shortness of  breath.   Cardiovascular: Negative for chest pain and palpitations.  Gastrointestinal: Negative for nausea, vomiting, diarrhea and constipation.  Genitourinary: Negative for dysuria.  Musculoskeletal: Negative for gait problem.  Skin: Positive for rash.  Neurological: Negative for dizziness, weakness, light-headedness, numbness and headaches.    Allergies  Penicillins  Home Medications   Current Outpatient Rx  Name  Route  Sig  Dispense  Refill  . aspirin EC 81 MG tablet   Oral   Take 81  mg by mouth daily.         Marland Kitchen atorvastatin (LIPITOR) 40 MG tablet   Oral   Take 40 mg by mouth daily.         . carvedilol (COREG) 3.125 MG tablet   Oral   Take 3.125 mg by mouth 2 (two) times daily with a meal.         . clopidogrel (PLAVIX) 75 MG tablet   Oral   Take 75 mg by mouth daily.         . diphenhydrAMINE (BENADRYL) 25 MG tablet   Oral   Take 25 mg by mouth every 6 (six) hours as needed for itching.         Marland Kitchen lisinopril (PRINIVIL,ZESTRIL) 10 MG tablet   Oral   Take 10 mg by mouth daily.           BP 146/95  Pulse 105  Temp(Src) 98.6 F (37 C) (Oral)  Resp 20  SpO2 98%  Physical Exam  Nursing note and vitals reviewed. Constitutional: He is oriented to person, place, and time. He appears well-developed and well-nourished. No distress.  HENT:  Head: Normocephalic and atraumatic.  Eyes: Conjunctivae and EOM are normal.  Neck: Normal range of motion. Neck supple.  No meningeal signs  Cardiovascular: Normal rate, regular rhythm and normal heart sounds.   Pulmonary/Chest: Effort normal.  Abdominal: Soft. There is no tenderness.  Musculoskeletal: Normal range of motion. He exhibits no edema and no tenderness.  Neurological: He is alert and oriented to person, place, and time. No cranial nerve deficit.  Skin: Skin is warm and dry. Rash noted. He is not diaphoretic. No erythema.  Diffuse patches of darkened skin and mild scaling in addition to scattered papules and pustules located diffusely across trunk and extremities. The papules are erythematous and urticarial. Pt also has healing wound on lumbar area of back. Non-erythematous, not warm to touch, no visible pus.   Psychiatric: He has a normal mood and affect.    ED Course  Procedures (including critical care time)  DIAGNOSTIC STUDIES: Oxygen Saturation is 98% on room air, normal by my interpretation.    COORDINATION OF CARE:    7:18 PM- Treatment plan discussed with patient. Pt agrees with  treatment.   Medications  dexamethasone (DECADRON) injection 10 mg (10 mg Intramuscular Given 05/05/13 1929)      Labs Reviewed - No data to display No results found. Discharge Medication List as of 05/05/2013  7:48 PM    START taking these medications   Details  !! diphenhydrAMINE (BENADRYL) 25 MG tablet Take 1 tablet (25 mg total) by mouth every 6 (six) hours as needed for itching (Rash)., Starting 05/05/2013, Until Discontinued, Print    hydrocortisone 2.5 % lotion Apply topically 2 (two) times daily., Starting 05/05/2013, Until Discontinued, Print    predniSONE (DELTASONE) 10 MG tablet Take by mouth: 3 tablets daily for 3 days, then 2 tablets daily for 3 days, then 1.5 tablets daily for 3 days,  then 1 tablet daily for 3 days, then 0.5 pills for 3 days, Print     !! - Potential duplicate medications found. Please discuss with provider.       1. Dermatitis       MDM  Pt has been seen for this pustular, urticarial rash resembling eczema in February, March, and April. Each time his medication is refilled and follow up with a dermatologist and establishing a relationship with a primary care provider is discussed. Discussed these facts again with pt who claims he is "very close" to having an appointment with a primary care doctor. Patient given dexamethasone IM with resolution of his itching. We'll discharge home with hydrocortisone lotion and prednisone taper. He's been instructed to take Benadryl by mouth every 6 hours for itching.   Pt also has healing wound on lumbar area of back which he says occurred when he was drying and rubbing his back with a towel and one of the pustules "popped." Non-erythematous, not warm to touch, no visible pus or drainage. Change the dressing for him and advised return precautions and signs of infection. Pt understood diagnosis and was agreeable to discharge.    I personally performed the services described in this documentation, which was scribed in my  presence. The recorded information has been reviewed and is accurate.    Glade Nurse, PA-C 05/06/13 [phone]

## 2013-05-05 NOTE — ED Notes (Signed)
Pt presents to department for medication refill of prednisone and hydrocortisone. States he frequently gets rash to chest and back. C/o of itching and discomfort at the time. Requesting medication refill for skin condition. Pt is alert and oriented x4. Respirations unlabored.

## 2013-05-07 NOTE — ED Provider Notes (Signed)
Medical screening examination/treatment/procedure(s) were performed by non-physician practitioner and as supervising physician I was immediately available for consultation/collaboration.   Carleene Cooper III, MD 05/07/13 1501

## 2013-05-08 ENCOUNTER — Encounter (INDEPENDENT_AMBULATORY_CARE_PROVIDER_SITE_OTHER): Payer: Self-pay | Admitting: General Surgery

## 2013-05-08 ENCOUNTER — Ambulatory Visit (INDEPENDENT_AMBULATORY_CARE_PROVIDER_SITE_OTHER): Payer: Medicaid Other | Admitting: General Surgery

## 2013-05-08 VITALS — BP 122/70 | HR 84 | Temp 98.3°F | Resp 16 | Ht 71.0 in | Wt 241.2 lb

## 2013-05-08 DIAGNOSIS — K603 Anal fistula, unspecified: Secondary | ICD-10-CM

## 2013-05-08 NOTE — Progress Notes (Signed)
Subjective:     Patient ID: Cameron Petty, male   DOB: 12/10/1956, 57 y.o.   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 He continues to have some soreness associated with the seton. He notes a small amount of drainage from the rectum. He denies any fevers or chills. He denies any constipation.  Review of Systems     Objective:   Physical Exam On exam his perirectal skin looks good. The seton was in place. There is no sign of infection or drainage. We were able to tighten the seton today and he tolerated this well.    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 We will plan to see him back in one month to check his progress.

## 2013-05-08 NOTE — Patient Instructions (Signed)
Continue to keep area clean and dry. 

## 2013-05-23 ENCOUNTER — Other Ambulatory Visit (HOSPITAL_COMMUNITY): Payer: Self-pay | Admitting: Cardiology

## 2013-05-23 DIAGNOSIS — R079 Chest pain, unspecified: Secondary | ICD-10-CM

## 2013-05-30 ENCOUNTER — Encounter (HOSPITAL_COMMUNITY): Payer: Medicaid Other

## 2013-05-30 ENCOUNTER — Encounter (HOSPITAL_COMMUNITY): Admission: RE | Admit: 2013-05-30 | Payer: Medicaid Other | Source: Ambulatory Visit

## 2013-06-05 ENCOUNTER — Encounter (INDEPENDENT_AMBULATORY_CARE_PROVIDER_SITE_OTHER): Payer: Medicaid Other | Admitting: General Surgery

## 2013-06-11 ENCOUNTER — Encounter (HOSPITAL_COMMUNITY)
Admission: RE | Admit: 2013-06-11 | Discharge: 2013-06-11 | Disposition: A | Discharge status: Home / Self Care | Payer: Medicaid Other | Source: Ambulatory Visit | Attending: Cardiology | Admitting: Cardiology

## 2013-06-11 ENCOUNTER — Emergency Department (HOSPITAL_COMMUNITY)
Admission: EM | Admit: 2013-06-11 | Discharge: 2013-06-11 | Disposition: A | Discharge status: Home / Self Care | Payer: Medicaid Other | Attending: Emergency Medicine | Admitting: Emergency Medicine

## 2013-06-11 ENCOUNTER — Other Ambulatory Visit: Payer: Self-pay

## 2013-06-11 ENCOUNTER — Encounter (HOSPITAL_COMMUNITY): Payer: Self-pay | Admitting: Emergency Medicine

## 2013-06-11 DIAGNOSIS — L309 Dermatitis, unspecified: Secondary | ICD-10-CM

## 2013-06-11 DIAGNOSIS — Z7902 Long term (current) use of antithrombotics/antiplatelets: Secondary | ICD-10-CM | POA: Insufficient documentation

## 2013-06-11 DIAGNOSIS — I252 Old myocardial infarction: Secondary | ICD-10-CM | POA: Insufficient documentation

## 2013-06-11 DIAGNOSIS — F17200 Nicotine dependence, unspecified, uncomplicated: Secondary | ICD-10-CM | POA: Insufficient documentation

## 2013-06-11 DIAGNOSIS — IMO0002 Reserved for concepts with insufficient information to code with codable children: Secondary | ICD-10-CM | POA: Insufficient documentation

## 2013-06-11 DIAGNOSIS — R079 Chest pain, unspecified: Secondary | ICD-10-CM | POA: Insufficient documentation

## 2013-06-11 DIAGNOSIS — L259 Unspecified contact dermatitis, unspecified cause: Secondary | ICD-10-CM | POA: Insufficient documentation

## 2013-06-11 DIAGNOSIS — Z880 Allergy status to penicillin: Secondary | ICD-10-CM | POA: Insufficient documentation

## 2013-06-11 DIAGNOSIS — Z7982 Long term (current) use of aspirin: Secondary | ICD-10-CM | POA: Insufficient documentation

## 2013-06-11 DIAGNOSIS — Z8673 Personal history of transient ischemic attack (TIA), and cerebral infarction without residual deficits: Secondary | ICD-10-CM | POA: Insufficient documentation

## 2013-06-11 DIAGNOSIS — Z79899 Other long term (current) drug therapy: Secondary | ICD-10-CM | POA: Insufficient documentation

## 2013-06-11 DIAGNOSIS — I10 Essential (primary) hypertension: Secondary | ICD-10-CM | POA: Insufficient documentation

## 2013-06-11 MED ORDER — DIPHENHYDRAMINE HCL 25 MG PO TABS: 25.0000 mg | ORAL_TABLET | Freq: Four times a day (QID) | ORAL | Status: DC | PRN

## 2013-06-11 MED ORDER — TECHNETIUM TC 99M SESTAMIBI GENERIC - CARDIOLITE
30.0000 | Freq: Once | INTRAVENOUS | Status: AC | PRN
  Administered 2013-06-11: 30 via INTRAVENOUS

## 2013-06-11 MED ORDER — REGADENOSON 0.4 MG/5ML IV SOLN: 0.4000 mg | Freq: Once | INTRAVENOUS | Status: DC

## 2013-06-11 MED ORDER — PREDNISONE 20 MG PO TABS: ORAL_TABLET | ORAL | Status: DC

## 2013-06-11 MED ORDER — DEXAMETHASONE SODIUM PHOSPHATE 10 MG/ML IJ SOLN
10.0000 mg | Freq: Once | INTRAMUSCULAR | Status: AC
  Administered 2013-06-11: 10 mg via INTRAMUSCULAR
  Filled 2013-06-11: qty 1

## 2013-06-11 MED ORDER — REGADENOSON 0.4 MG/5ML IV SOLN
INTRAVENOUS | Status: AC
  Administered 2013-06-11: 0.4 mg
  Filled 2013-06-11: qty 5

## 2013-06-11 MED ORDER — TECHNETIUM TC 99M SESTAMIBI GENERIC - CARDIOLITE
10.0000 | Freq: Once | INTRAVENOUS | Status: AC | PRN
  Administered 2013-06-11: 10 via INTRAVENOUS

## 2013-06-11 NOTE — ED Notes (Signed)
Pt reports being seen here for itchy rash and burning to back, neck and arms. States he ran out of prednisone and now rash is worse than before. Pt VSS, NAD, alert, oriented x4.

## 2013-06-11 NOTE — ED Provider Notes (Signed)
History     CSN: [id number]  Arrival date & time 06/11/13  1129   First MD Initiated Contact with Patient 06/11/13 1312      Chief Complaint  Patient presents with  . Rash    (Consider location/radiation/quality/duration/timing/severity/associated sxs/prior treatment) HPI Comments: 57 year old male who presents today for a pruritic rash. He states he has had 5 prior visits for the same. He states they always give him a shot and some pills that work well for his symptoms. He has an appointment with a dermatologist at the end of the month. He recently got his Medicaid July he was unable to make it up another dermatologist earlier. Rash has been there for years. It is pleuritic. It is still very itchy on his back, better on his abdomen. It is very itchy on his head. No fevers, chills, nausea, vomiting, abdominal pain, numbness, weakness.   Patient is a 57 y.o. male presenting with rash. The history is provided by the patient. No language interpreter was used.  Rash Associated symptoms: no chills and no fever     Past Medical History  Diagnosis Date  . Hypertension   . Stroke 2007    mini stroke, no residule now  . Myocardial infarction 2007    Past Surgical History  Procedure Laterality Date  . Gallbladder surg in 2012    . Incision and drainage perirectal abscess  08/11/2012    Procedure: IRRIGATION AND DEBRIDEMENT PERIRECTAL ABSCESS;  Surgeon: Robyne Askew, MD;  Location: [facility] OR;  Service: General;  Laterality: N/A;  . Cardiac catheterization  2007    stents placed  . Incision and drainage abscess  11/16/2012    Procedure: INCISION AND DRAINAGE ABSCESS;  Surgeon: Robyne Askew, MD;  Location: [facility] OR;  Service: General;  Laterality: N/A;  incision and drainage perineal abscess    Family History  Problem Relation Age of Onset  . Heart disease Brother   . Cancer Maternal Uncle     breast    History  Substance Use Topics  . Smoking status: Current Every Day Smoker --  0.25 packs/day for 30 years    Last Attempt to Quit: 05/16/2012  . Smokeless tobacco: Not on file  . Alcohol Use: Yes     Comment: occasionally      Review of Systems  Constitutional: Negative for fever and chills.  Skin: Positive for rash.  All other systems reviewed and are negative.    Allergies  Penicillins  Home Medications   Current Outpatient Rx  Name  Route  Sig  Dispense  Refill  . aspirin EC 81 MG tablet   Oral   Take 81 mg by mouth daily.         Marland Kitchen atorvastatin (LIPITOR) 40 MG tablet   Oral   Take 40 mg by mouth daily.         . carvedilol (COREG) 3.125 MG tablet   Oral   Take 3.125 mg by mouth 2 (two) times daily with a meal.         . clopidogrel (PLAVIX) 75 MG tablet   Oral   Take 75 mg by mouth daily.         . diphenhydrAMINE (BENADRYL) 25 MG tablet   Oral   Take 1 tablet (25 mg total) by mouth every 6 (six) hours as needed for itching (Rash).   30 tablet   0   . hydrocortisone 2.5 % lotion   Topical   Apply  topically 2 (two) times daily.   59 mL   0   . lisinopril (PRINIVIL,ZESTRIL) 10 MG tablet   Oral   Take 10 mg by mouth daily.         . diphenhydrAMINE (BENADRYL) 25 MG tablet   Oral   Take 1 tablet (25 mg total) by mouth every 6 (six) hours as needed for itching (Rash).   30 tablet   0   . predniSONE (DELTASONE) 10 MG tablet      Take by mouth: 3 tablets daily for 3 days, then 2 tablets daily for 3 days, then 1.5 tablets daily for 3 days, then 1 tablet daily for 3 days, then 0.5 pills for 3 days   27 tablet   0   . predniSONE (DELTASONE) 20 MG tablet      3 tabs po daily x 3 days, then 2 tabs x 3 days, then 1.5 tabs x 3 days, then 1 tab x 3 days, then 0.5 tabs x 3 days   27 tablet   0     BP 160/84  Pulse 84  Temp(Src) 98.2 F (36.8 C) (Oral)  Resp 16  SpO2 100%  Physical Exam  Nursing note and vitals reviewed. Constitutional: He is oriented to person, place, and time. He appears well-developed and  well-nourished. No distress.  HENT:  Head: Normocephalic and atraumatic.  Right Ear: External ear normal.  Left Ear: External ear normal.  Nose: Nose normal.  Eyes: Conjunctivae are normal.  Neck: Normal range of motion. Neck supple. No tracheal deviation present.  Cardiovascular: Normal rate, regular rhythm and normal heart sounds.   Pulmonary/Chest: Effort normal and breath sounds normal. No stridor.  Abdominal: Soft. He exhibits no distension. There is no tenderness.  Musculoskeletal: Normal range of motion.  Neurological: He is alert and oriented to person, place, and time.  Skin: Skin is warm and dry. Rash noted. He is not diaphoretic.  Diffuse patches of darkened skin over entire body, worse on back; scattered pustules  Psychiatric: He has a normal mood and affect. His behavior is normal.    ED Course  Procedures (including critical care time)  Labs Reviewed - No data to display Nm Myocar Multi W/spect W/wall Motion / Ef  06/11/2013   *RADIOLOGY REPORT*  Clinical Data:  Chest pain, 57 year old male  MYOCARDIAL IMAGING WITH SPECT (REST AND PHARMACOLOGIC-STRESS) GATED LEFT VENTRICULAR WALL MOTION STUDY LEFT VENTRICULAR EJECTION FRACTION  Technique:  Resting myocardial SPECT imaging was initially performed after intravenous administration of radiopharmaceutical. Myocardial SPECT was subsequently performed after additional radiopharmaceutical injection during pharmacologic-stress supervised by the Cardiology staff.  Quantitative gated imaging was also performed to evaluate left ventricular wall motion, and estimate left ventricular ejection fraction.  Radiopharmaceutical:  Tc-46m Cardiolite at rest and during stress.  Comparison: none  Findings:  Technique: Study is adequate.  Perfusion:  There are no relative decreased counts on stress or rest to suggest reversible ischemia or infarction.  Wall motion:  No focal wall motion abnormality.  Normal contractility.  Left ventricular  ejection fraction:  Calculated left ventricular ejection fraction = 47%  End-diastolic volume was hard 134 ml End-systolic volume  71 ml  IMPRESSION:  1.  No reversible ischemia or infarction. 2.  Normal wall motion. 3.  Left ventricular ejection fraction equal47%   Original Report Authenticated By: Genevive Bi, M.D.     1. Dermatitis       MDM  Pt has been seen for this pustular, urticarial  rash resembling eczema in February, March, April, and May. He states he finally has medicaid and has a dermatology appointment at the end of the month. Patient given dexamethasone IM with resolution of his itching. Discharge home with prednisone taper and benadryl. No evidence of SJS or necrotizing fasciitis. No blisters, no pustules, no warmth, no draining sinus tracts, no superficial abscesses, no bullous impetigo, no vesicles, no desquamation, no target lesions with dusky purpura or a central bulla. Not tender to touch.          Mora Bellman, PA-C 06/12/13 1812

## 2013-06-14 NOTE — ED Provider Notes (Signed)
Medical screening examination/treatment/procedure(s) were performed by non-physician practitioner and as supervising physician I was immediately available for consultation/collaboration.   Carleene Cooper III, MD 06/14/13 1000

## 2013-06-26 ENCOUNTER — Encounter (INDEPENDENT_AMBULATORY_CARE_PROVIDER_SITE_OTHER): Payer: Self-pay | Admitting: General Surgery

## 2013-06-26 ENCOUNTER — Ambulatory Visit (INDEPENDENT_AMBULATORY_CARE_PROVIDER_SITE_OTHER): Payer: Medicaid Other | Admitting: General Surgery

## 2013-06-26 VITALS — BP 142/66 | HR 84 | Temp 97.0°F | Resp 12 | Ht 70.0 in | Wt 244.0 lb

## 2013-06-26 DIAGNOSIS — K603 Anal fistula: Secondary | ICD-10-CM

## 2013-06-26 NOTE — Patient Instructions (Signed)
Continue to keep area clean and dry. 

## 2013-07-11 ENCOUNTER — Encounter (HOSPITAL_COMMUNITY): Payer: Self-pay | Admitting: Neurology

## 2013-07-11 ENCOUNTER — Emergency Department (HOSPITAL_COMMUNITY)
Admission: EM | Admit: 2013-07-11 | Discharge: 2013-07-11 | Disposition: A | Discharge status: Home / Self Care | Payer: Medicaid Other | Attending: Emergency Medicine | Admitting: Emergency Medicine

## 2013-07-11 DIAGNOSIS — Z7982 Long term (current) use of aspirin: Secondary | ICD-10-CM | POA: Insufficient documentation

## 2013-07-11 DIAGNOSIS — Z79899 Other long term (current) drug therapy: Secondary | ICD-10-CM | POA: Insufficient documentation

## 2013-07-11 DIAGNOSIS — F17200 Nicotine dependence, unspecified, uncomplicated: Secondary | ICD-10-CM | POA: Insufficient documentation

## 2013-07-11 DIAGNOSIS — I10 Essential (primary) hypertension: Secondary | ICD-10-CM | POA: Insufficient documentation

## 2013-07-11 DIAGNOSIS — R21 Rash and other nonspecific skin eruption: Secondary | ICD-10-CM | POA: Insufficient documentation

## 2013-07-11 DIAGNOSIS — L738 Other specified follicular disorders: Secondary | ICD-10-CM | POA: Insufficient documentation

## 2013-07-11 DIAGNOSIS — L739 Follicular disorder, unspecified: Secondary | ICD-10-CM

## 2013-07-11 MED ORDER — MUPIROCIN 2 % EX OINT: TOPICAL_OINTMENT | Freq: Three times a day (TID) | CUTANEOUS | Status: DC

## 2013-07-11 MED ORDER — PREDNISONE 20 MG PO TABS: 40.0000 mg | ORAL_TABLET | Freq: Every day | ORAL | Status: DC

## 2013-07-11 MED ORDER — TRIAMCINOLONE ACETONIDE 0.1 % EX CREA: TOPICAL_CREAM | Freq: Two times a day (BID) | CUTANEOUS | Status: DC

## 2013-07-11 MED ORDER — HYDROXYZINE HCL 25 MG PO TABS: 25.0000 mg | ORAL_TABLET | Freq: Four times a day (QID) | ORAL | Status: DC

## 2013-07-11 NOTE — ED Provider Notes (Signed)
Medical screening examination/treatment/procedure(s) were performed by non-physician practitioner and as supervising physician I was immediately available for consultation/collaboration.   Dorianne Perret, MD 07/11/13 1511 

## 2013-07-11 NOTE — ED Notes (Signed)
Pt reporting dermatitis to back chest and head states is out of prednisone. Airway intact. Is itchy.

## 2013-07-11 NOTE — ED Provider Notes (Signed)
History    CSN: [id number] Arrival date & time 07/11/13  9629  First MD Initiated Contact with Patient 07/11/13 0827     Chief Complaint  Patient presents with  . Rash   (Consider location/radiation/quality/duration/timing/severity/associated sxs/prior Treatment) HPI  Cameron Petty Is a(n)56 y.o. Who presents with cc of rash. He has been seen for the same 6 times here at the ED. Patient has his first scheduled DERM appot at the end of this month. He  C/o if itchy rash that resolves with steroid but comes back in new palces at the end of each burst. The rash is currently worse on his head.  He complains of severe itching pain and pustules.  He also has severe itching in the mid thoracic region on his back.  He denies any new lotions or soaps however he watches his body with differential of total body man's care. He has no contacts with similar symptoms. Denies fevers, chills, myalgias, arthralgias. Denies DOE, SOB, chest tightness or pressure, radiation to left arm, jaw or back, or diaphoresis. Denies dysuria, flank pain, suprapubic pain, frequency, urgency, or hematuria. Denies headaches, light headedness, weakness, visual disturbances. Denies abdominal pain, nausea, vomiting, diarrhea or constipation.   Past Medical History  Diagnosis Date  . Hypertension   . Stroke 2007    mini stroke, no residule now  . Myocardial infarction 2007   Past Surgical History  Procedure Laterality Date  . Gallbladder surg in 2012    . Incision and drainage perirectal abscess  08/11/2012    Procedure: IRRIGATION AND DEBRIDEMENT PERIRECTAL ABSCESS;  Surgeon: Robyne Askew, MD;  Location: [facility] OR;  Service: General;  Laterality: N/A;  . Cardiac catheterization  2007    stents placed  . Incision and drainage abscess  11/16/2012    Procedure: INCISION AND DRAINAGE ABSCESS;  Surgeon: Robyne Askew, MD;  Location: [facility] OR;  Service: General;  Laterality: N/A;  incision and drainage perineal abscess    Family History  Problem Relation Age of Onset  . Heart disease Brother   . Cancer Maternal Uncle     breast   History  Substance Use Topics  . Smoking status: Current Every Day Smoker -- 0.25 packs/day for 30 years    Last Attempt to Quit: 05/16/2012  . Smokeless tobacco: Not on file  . Alcohol Use: Yes     Comment: occasionally    Review of Systems  Constitutional: Negative for fever and chills.  HENT: Negative for congestion, neck pain and voice change.   Respiratory: Negative for cough, shortness of breath and wheezing.   Cardiovascular: Negative for chest pain and palpitations.  Gastrointestinal: Negative for vomiting, abdominal pain, diarrhea and constipation.  Musculoskeletal: Negative for myalgias and arthralgias.  Skin: Positive for rash. Negative for wound.  Neurological: Negative for weakness and headaches.    Allergies  Penicillins  Home Medications   Current Outpatient Rx  Name  Route  Sig  Dispense  Refill  . aspirin EC 81 MG tablet   Oral   Take 81 mg by mouth daily.         Marland Kitchen atorvastatin (LIPITOR) 40 MG tablet   Oral   Take 40 mg by mouth daily.         . carvedilol (COREG) 3.125 MG tablet   Oral   Take 3.125 mg by mouth 2 (two) times daily with a meal.         . clopidogrel (PLAVIX) 75 MG tablet  Oral   Take 75 mg by mouth daily.         . diphenhydrAMINE (BENADRYL) 25 MG tablet   Oral   Take 1 tablet (25 mg total) by mouth every 6 (six) hours as needed for itching (Rash).   30 tablet   0   . lisinopril (PRINIVIL,ZESTRIL) 10 MG tablet   Oral   Take 10 mg by mouth daily.          BP 172/96  Pulse 96  Temp(Src) 98.2 F (36.8 C) (Oral)  Resp 18  SpO2 97% Physical Exam  Nursing note and vitals reviewed. Constitutional: He appears well-developed and well-nourished. No distress.  HENT:  Head: Normocephalic and atraumatic.  Eyes: Conjunctivae are normal. No scleral icterus.  Neck: Normal range of motion. Neck supple.   Cardiovascular: Normal rate, regular rhythm and normal heart sounds.   Pulmonary/Chest: Effort normal and breath sounds normal. No respiratory distress.  Abdominal: Soft. There is no tenderness.  Musculoskeletal: He exhibits no edema.  Neurological: He is alert.  Skin: Skin is warm and dry. He is not diaphoretic.  Multiple reddened papules and pustules covering the entire scalp.  Patient has some flaking and crusting around the nasal ala and ears.  Patient has multiple areas of hyperpigmentation on his back with some superficial peeling of the skin.  There is no erythema, induration or signs of infection.  Psychiatric: His behavior is normal.    ED Course  Procedures (including critical care time) Labs Reviewed - No data to display No results found. 1. Rash   2. Folliculitis     MDM  8:55 AM Filed Vitals:   07/11/13 0832  BP: 172/96  Pulse: 96  Temp: 98.2 F (36.8 C)  Resp: 18   Patient here with complaint of recurrent rash.  He has followup with dermatology.  Patient states he is unable to get in until the end of the month.  He states that his prednisone and topical ceroid have been helping.  Patient does appear to have the folliculitis of the scalp.  Change the patient to topical steroid and mupirocin. Suggest dye free, perfume free lotions and soaps. Refill steroid and atarax.  Arthor Captain, PA-C 07/11/13 [phone]

## 2013-07-16 NOTE — Progress Notes (Signed)
Subjective:     Patient ID: Cameron Petty, male   DOB: Apr 12, 1956, 57 y.o.   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 He still has some soreness at the site. He denies any drainage. His appetite is good and his bowels are working normally.  Review of Systems     Objective:   Physical Exam On exam the area where the seton is seems to be clean with no evidence of infection. 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 We'll plan to see him back in one month to check his progress

## 2013-07-27 ENCOUNTER — Ambulatory Visit (INDEPENDENT_AMBULATORY_CARE_PROVIDER_SITE_OTHER): Payer: Medicaid Other | Admitting: General Surgery

## 2013-07-27 ENCOUNTER — Encounter (INDEPENDENT_AMBULATORY_CARE_PROVIDER_SITE_OTHER): Payer: Self-pay | Admitting: General Surgery

## 2013-07-27 VITALS — BP 144/84 | HR 100 | Temp 98.4°F | Resp 16 | Ht 70.0 in | Wt 243.6 lb

## 2013-07-27 DIAGNOSIS — K603 Anal fistula: Secondary | ICD-10-CM

## 2013-07-27 NOTE — Patient Instructions (Signed)
Keep area clean and dry. °

## 2013-07-28 ENCOUNTER — Encounter (INDEPENDENT_AMBULATORY_CARE_PROVIDER_SITE_OTHER): Payer: Self-pay | Admitting: General Surgery

## 2013-07-28 NOTE — Progress Notes (Signed)
Subjective:     Patient ID: Cameron Petty, male   DOB: 1956/01/28, 57 y.o.   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 He continues to have some intermittent soreness associated with the seton. He occasionally notices some discharge from the area. His appetite is good and his bowels are working normally.  Review of Systems     Objective:   Physical Exam On exam the perirectal area is relatively clean. The seton was in place. We were able to place another 0 silk tie closer to the base to add more tension to the seton. He tolerated this well.    Assessment:     The patient is 10 months status post placement of a seton     Plan:     At this point he will continue to keep this area clean and dry. We will see him back in one month to check his progress.

## 2013-09-04 ENCOUNTER — Encounter (INDEPENDENT_AMBULATORY_CARE_PROVIDER_SITE_OTHER): Payer: Self-pay | Admitting: General Surgery

## 2013-09-04 ENCOUNTER — Ambulatory Visit (INDEPENDENT_AMBULATORY_CARE_PROVIDER_SITE_OTHER): Payer: Medicaid Other | Admitting: General Surgery

## 2013-09-04 VITALS — BP 158/96 | HR 74 | Resp 16 | Ht 70.0 in | Wt 250.0 lb

## 2013-09-04 DIAGNOSIS — K603 Anal fistula: Secondary | ICD-10-CM

## 2013-09-04 NOTE — Progress Notes (Signed)
Subjective:     Patient ID: Cameron Petty, male   DOB: 11/15/1956, 57 y.o.   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 About 2 weeks ago the seton finally pulled through and fell out. He has had some small amount of intermittent drainage from the area since then. He has had no problems with incontinence. His soreness is gradually improving.  Review of Systems     Objective:   Physical Exam On exam there is a small open wound that is clean where the rubber band has pulled through. He has decent rectal tone.    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 If it does not heal completely we will refer him to our colorectal specialist. We will check him one more time in about a month.

## 2013-09-04 NOTE — Patient Instructions (Signed)
Keep area clean and dry. °

## 2013-09-16 ENCOUNTER — Emergency Department (HOSPITAL_COMMUNITY)
Admission: EM | Admit: 2013-09-16 | Discharge: 2013-09-16 | Disposition: A | Discharge status: Home / Self Care | Payer: Medicaid Other | Attending: Emergency Medicine | Admitting: Emergency Medicine

## 2013-09-16 ENCOUNTER — Encounter (HOSPITAL_COMMUNITY): Payer: Self-pay | Admitting: Emergency Medicine

## 2013-09-16 DIAGNOSIS — I252 Old myocardial infarction: Secondary | ICD-10-CM | POA: Insufficient documentation

## 2013-09-16 DIAGNOSIS — H109 Unspecified conjunctivitis: Secondary | ICD-10-CM | POA: Insufficient documentation

## 2013-09-16 DIAGNOSIS — Z79899 Other long term (current) drug therapy: Secondary | ICD-10-CM | POA: Insufficient documentation

## 2013-09-16 DIAGNOSIS — Z880 Allergy status to penicillin: Secondary | ICD-10-CM | POA: Insufficient documentation

## 2013-09-16 DIAGNOSIS — J029 Acute pharyngitis, unspecified: Secondary | ICD-10-CM

## 2013-09-16 DIAGNOSIS — Z7982 Long term (current) use of aspirin: Secondary | ICD-10-CM | POA: Insufficient documentation

## 2013-09-16 DIAGNOSIS — L259 Unspecified contact dermatitis, unspecified cause: Secondary | ICD-10-CM | POA: Insufficient documentation

## 2013-09-16 DIAGNOSIS — H1010 Acute atopic conjunctivitis, unspecified eye: Secondary | ICD-10-CM

## 2013-09-16 DIAGNOSIS — I10 Essential (primary) hypertension: Secondary | ICD-10-CM | POA: Insufficient documentation

## 2013-09-16 DIAGNOSIS — Z8673 Personal history of transient ischemic attack (TIA), and cerebral infarction without residual deficits: Secondary | ICD-10-CM | POA: Insufficient documentation

## 2013-09-16 DIAGNOSIS — J3489 Other specified disorders of nose and nasal sinuses: Secondary | ICD-10-CM | POA: Insufficient documentation

## 2013-09-16 DIAGNOSIS — F17200 Nicotine dependence, unspecified, uncomplicated: Secondary | ICD-10-CM | POA: Insufficient documentation

## 2013-09-16 DIAGNOSIS — L309 Dermatitis, unspecified: Secondary | ICD-10-CM

## 2013-09-16 DIAGNOSIS — Z7901 Long term (current) use of anticoagulants: Secondary | ICD-10-CM | POA: Insufficient documentation

## 2013-09-16 MED ORDER — FEXOFENADINE HCL 60 MG PO TABS: 60.0000 mg | ORAL_TABLET | Freq: Two times a day (BID) | ORAL | Status: DC

## 2013-09-16 MED ORDER — HYDROXYZINE HCL 25 MG PO TABS: 25.0000 mg | ORAL_TABLET | Freq: Four times a day (QID) | ORAL | Status: DC

## 2013-09-16 MED ORDER — TRIAMCINOLONE ACETONIDE 0.1 % EX CREA
TOPICAL_CREAM | Freq: Two times a day (BID) | CUTANEOUS | Status: DC
Start: 2013-09-16 — End: 2014-06-10

## 2013-09-16 MED ORDER — DEXAMETHASONE SODIUM PHOSPHATE 10 MG/ML IJ SOLN
10.0000 mg | Freq: Once | INTRAMUSCULAR | Status: AC
  Administered 2013-09-16: 10 mg via INTRAMUSCULAR
  Filled 2013-09-16: qty 1

## 2013-09-16 NOTE — Discharge Instructions (Signed)
Continue moisturizing cream for the rash twice a day. Takes Allegra as prescribed daily. Take hydroxyzine for itching as needed. Apply Kenalog cream to the rash area as prescribed. Followup with her primary care Dr.Hay Fever  Hay fever is a type of allergy that people have to things like grass, animals, or pollen from plants and flowers. It cannot be passed from one person to another. You cannot cure hay fever, but there are things that may help relieve your problems (symptoms). HOME CARE  Avoid the things that may be causing your problems.  Take all medicine as told by your doctor. GET HELP RIGHT AWAY IF:  You have asthma, a cough, and you start making whistling sounds when breathing (wheezing).  Your tongue or lips are puffy (swollen).  You have trouble breathing.  You feel lightheaded or like you will pass out (faint).  You have a fever.  Your problems are getting worse and your medicine is not helping.  Your treatment was working, but your problems have come back.  You are stuffed up (congested) and have pressure in your face.  You have a headache.  You have cold sweats. MAKE SURE YOU:  Understand these instructions.  Will watch your condition.  Will get help right away if you are not doing well or get worse. Document Released: 04/14/2011 Document Revised: 03/06/2012 Document Reviewed: 04/14/2011 [facility] Patient Information 2014 [place], [place].  Eczema Atopic dermatitis, or eczema, is an inherited type of sensitive skin. Often people with eczema have a family history of allergies, asthma, or hay fever. It causes a red itchy rash and dry scaly skin. The itchiness may occur before the skin rash and may be very intense. It is not contagious. Eczema is generally worse during the cooler winter months and often improves with the warmth of summer. Eczema usually starts showing signs in infancy. Some children outgrow eczema, but it may last through adulthood. Flare-ups may be  caused by:  Eating something or contact with something you are sensitive or allergic to.  Stress. DIAGNOSIS  The diagnosis of eczema is usually based upon symptoms and medical history. TREATMENT  Eczema cannot be cured, but symptoms usually can be controlled with treatment or avoidance of allergens (things to which you are sensitive or allergic to).  Controlling the itching and scratching.  Use over-the-counter antihistamines as directed for itching. It is especially useful at night when the itching tends to be worse.  Use over-the-counter steroid creams as directed for itching.  Scratching makes the rash and itching worse and may cause impetigo (a skin infection) if fingernails are contaminated (dirty).  Keeping the skin well moisturized with creams every day. This will seal in moisture and help prevent dryness. Lotions containing alcohol and water can dry the skin and are not recommended.  Limiting exposure to allergens.  Recognizing situations that cause stress.  Developing a plan to manage stress. HOME CARE INSTRUCTIONS   Take prescription and over-the-counter medicines as directed by your caregiver.  Do not use anything on the skin without checking with your caregiver.  Keep baths or showers short (5 minutes) in warm (not hot) water. Use mild cleansers for bathing. You may add non-perfumed bath oil to the bath water. It is best to avoid soap and bubble bath.  Immediately after a bath or shower, when the skin is still damp, apply a moisturizing ointment to the entire body. This ointment should be a petroleum ointment. This will seal in moisture and help prevent dryness. The thicker  the ointment the better. These should be unscented.  Keep fingernails cut short and wash hands often. If your child has eczema, it may be necessary to put soft gloves or mittens on your child at night.  Dress in clothes made of cotton or cotton blends. Dress lightly, as heat increases  itching.  Avoid foods that may cause flare-ups. Common foods include cow's milk, peanut butter, eggs and wheat.  Keep a child with eczema away from anyone with fever blisters. The virus that causes fever blisters (herpes simplex) can cause a serious skin infection in children with eczema. SEEK MEDICAL CARE IF:   Itching interferes with sleep.  The rash gets worse or is not better within one week following treatment.  The rash looks infected (pus or soft yellow scabs).  You or your child has an oral temperature above 102 F (38.9 C).  Your baby is older than 3 months with a rectal temperature of 100.5 F (38.1 C) or higher for more than 1 day.  The rash flares up after contact with someone who has fever blisters. SEEK IMMEDIATE MEDICAL CARE IF:   Your baby is older than 3 months with a rectal temperature of 102 F (38.9 C) or higher.  Your baby is older than 3 months or younger with a rectal temperature of 100.4 F (38 C) or higher. Document Released: 12/10/2000 Document Revised: 03/06/2012 Document Reviewed: 10/15/2009 [facility] Patient Information 2014 [place], [place].

## 2013-09-16 NOTE — ED Notes (Addendum)
Pt c/o rash on back of neck, back, chest and under chin that comes and goes x a few months and has been prescribed medication with no relief. C/o sore throat x 3 days and watery itchy red eyes x 2 weeks. Pt has also been coughing up yellow sputum x 1 week.

## 2013-09-16 NOTE — ED Provider Notes (Signed)
CSN: [id number]     Arrival date & time 09/16/13  7829 History   First MD Initiated Contact with Patient 09/16/13 [phone]     Chief Complaint  Patient presents with  . Sore Throat  . Rash  . Eye Problem   (Consider location/radiation/quality/duration/timing/severity/associated sxs/prior Treatment) HPI Cameron Petty is a 57 y.o. male who presents to ED with complaint of a rash, itchy watery eyes, sore throat. States he has had a rash for multiple years. States flares up on and off. Usually has to come to ED to get decadron shot and prednisone. States also takes vistaril, benadryl, kenalog cream. States that he ran out of all of his medications. States rash is very itchy, mostly over torso and upper extremities. Pt also complaining of watery and itchy eyes, nasal congestion, sore throat for several days. States that he has not had fever, no cough, no cp, sob, n/v, abdominal pain. Has not taken any medications for this. No other complaints. No current PCP.    Past Medical History  Diagnosis Date  . Hypertension   . Stroke 2007    mini stroke, no residule now  . Myocardial infarction 2007   Past Surgical History  Procedure Laterality Date  . Gallbladder surg in 2012    . Incision and drainage perirectal abscess  08/11/2012    Procedure: IRRIGATION AND DEBRIDEMENT PERIRECTAL ABSCESS;  Surgeon: Robyne Askew, MD;  Location: [facility] OR;  Service: General;  Laterality: N/A;  . Cardiac catheterization  2007    stents placed  . Incision and drainage abscess  11/16/2012    Procedure: INCISION AND DRAINAGE ABSCESS;  Surgeon: Robyne Askew, MD;  Location: [facility] OR;  Service: General;  Laterality: N/A;  incision and drainage perineal abscess   Family History  Problem Relation Age of Onset  . Heart disease Brother   . Cancer Maternal Uncle     breast   History  Substance Use Topics  . Smoking status: Current Every Day Smoker -- 0.25 packs/day for 30 years    Last Attempt to Quit: 05/16/2012  .  Smokeless tobacco: Never Used  . Alcohol Use: No     Comment: occasionally    Review of Systems  Constitutional: Negative for fever and chills.  HENT: Negative for neck pain and neck stiffness.   Respiratory: Negative for cough, chest tightness and shortness of breath.   Cardiovascular: Negative for chest pain, palpitations and leg swelling.  Gastrointestinal: Negative for nausea, vomiting, abdominal pain, diarrhea and abdominal distention.  Genitourinary: Negative for dysuria, urgency, frequency and hematuria.  Musculoskeletal: Negative for myalgias and arthralgias.  Skin: Negative for rash.  Allergic/Immunologic: Negative for immunocompromised state.  Neurological: Negative for dizziness, weakness, light-headedness, numbness and headaches.    Allergies  Penicillins  Home Medications   Current Outpatient Rx  Name  Route  Sig  Dispense  Refill  . aspirin EC 81 MG tablet   Oral   Take 81 mg by mouth daily.         Marland Kitchen atorvastatin (LIPITOR) 40 MG tablet   Oral   Take 40 mg by mouth daily.         . carvedilol (COREG) 3.125 MG tablet   Oral   Take 3.125 mg by mouth 2 (two) times daily with a meal.         . clopidogrel (PLAVIX) 75 MG tablet   Oral   Take 75 mg by mouth daily.         Marland Kitchen  diphenhydrAMINE (BENADRYL) 25 MG tablet   Oral   Take 1 tablet (25 mg total) by mouth every 6 (six) hours as needed for itching (Rash).   30 tablet   0   . hydrOXYzine (ATARAX/VISTARIL) 25 MG tablet   Oral   Take 1 tablet (25 mg total) by mouth every 6 (six) hours.   30 tablet   0   . lisinopril (PRINIVIL,ZESTRIL) 10 MG tablet   Oral   Take 10 mg by mouth daily.         . mupirocin ointment (BACTROBAN) 2 %   Topical   Apply topically 3 (three) times daily.   22 g   0   . predniSONE (DELTASONE) 20 MG tablet   Oral   Take 2 tablets (40 mg total) by mouth daily.   10 tablet   0   . triamcinolone cream (KENALOG) 0.1 %   Topical   Apply topically 2 (two) times  daily. apply to affected area 2 times daily. Do not apply to face.   15 g   2    BP 119/86  Pulse 88  Temp(Src) 98.2 F (36.8 C) (Oral)  Resp 18  SpO2 99% Physical Exam  Nursing note and vitals reviewed. Constitutional: He appears well-developed and well-nourished. No distress.  HENT:  Head: Normocephalic and atraumatic.  Right Ear: External ear normal.  Left Ear: External ear normal.  Mouth/Throat: Oropharynx is clear and moist.  Clear nasal congestion. Pharynx is normal, tonsils normal, uvula is midline. TMs are normal bilaterally  Eyes: Pupils are equal, round, and reactive to light.  Bilateral eyes are watery, mild conjunctival injection  Neck: Normal range of motion. Neck supple.  Cardiovascular: Normal rate, regular rhythm and normal heart sounds.   Pulmonary/Chest: Effort normal. No respiratory distress. He has no wheezes. He has no rales.  Abdominal: Soft. Bowel sounds are normal. He exhibits no distension. There is no tenderness. There is no rebound.  Musculoskeletal: He exhibits no edema.  Lymphadenopathy:    He has no cervical adenopathy.  Neurological: He is alert.  Skin: Skin is warm and dry.  Dry daily patchy rash over bilateral forearms and antecubital fossa is. Some patches over the posterior neck and upper back.    ED Course  Procedures (including critical care time) Labs Review Labs Reviewed - No data to display Imaging Review No results found.  MDM   1. Eczema   2. Seasonal allergic conjunctivitis   3. Sore throat      Patient's rash is most consistent with eczema. It appears to be chronic. There is no signs of infection. He has been on prednisone every single month, and I discussed that it probably is not the best treatment for his eczema at this time. I did give him a Decadron injection in emergency department but will not refill his prednisone. I will start patient on Kenalog cream, moisturizing creams twice a day, he can continue Vistaril. I  will also start him on allergy medicine daily such as Allegra. He does have symptoms consistent with possible seasonal allergies which include watery and itchy eyes, sore throat, nasal congestion. I do not think he has a viral or bacterial infection at this time, he is afebrile, nontoxic appearing. Patient does not have any signs or exam findings to suggest any life-threatening process at this time and is stable for discharge home. He is instructed to followup with his primary care Dr.   Ceasar Mons Vitals:   09/16/13 0950  BP:  119/86  Pulse: 88  Temp: 98.2 F (36.8 C)  TempSrc: Oral  Resp: 18  SpO2: 99%      Cynthie Garmon A Dak Szumski, PA-C 09/16/13 1049

## 2013-09-16 NOTE — ED Provider Notes (Signed)
Medical screening examination/treatment/procedure(s) were performed by non-physician practitioner and as supervising physician I was immediately available for consultation/collaboration.   Shanna Cisco, MD 09/16/13 1246

## 2013-10-02 ENCOUNTER — Ambulatory Visit (INDEPENDENT_AMBULATORY_CARE_PROVIDER_SITE_OTHER): Payer: Medicaid Other | Admitting: General Surgery

## 2013-10-02 ENCOUNTER — Encounter (INDEPENDENT_AMBULATORY_CARE_PROVIDER_SITE_OTHER): Payer: Self-pay | Admitting: General Surgery

## 2013-10-02 VITALS — BP 120/68 | HR 76 | Temp 98.0°F | Resp 14 | Ht 70.0 in | Wt 246.0 lb

## 2013-10-02 DIAGNOSIS — K603 Anal fistula: Secondary | ICD-10-CM

## 2013-10-02 MED ORDER — OXYCODONE-ACETAMINOPHEN 5-325 MG PO TABS: 1.0000 | ORAL_TABLET | Freq: Four times a day (QID) | ORAL | Status: DC | PRN

## 2013-10-02 NOTE — Patient Instructions (Signed)
Will refer to Dr. Maisie Fus our colorectal specialist

## 2013-10-03 NOTE — Progress Notes (Signed)
Subjective:     Patient ID: Cameron Petty, male   DOB: [birth year], 57 y.o.   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 Since the seton came out he has had some gradual improvement. He still complains though of some soreness in his rectum. He denies any bleeding or drainage. He does note that occasionally he will get some leakage of stool. He seems to be having normal bowel movements.  Review of Systems     Objective:   Physical Exam On exam his perirectal area seems to be healed. He has decreased rectal tone. There is no palpable mass.    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 I will plan to see him back on a when necessary basis

## 2013-10-23 ENCOUNTER — Ambulatory Visit (INDEPENDENT_AMBULATORY_CARE_PROVIDER_SITE_OTHER): Payer: Medicaid Other | Admitting: General Surgery

## 2013-10-23 ENCOUNTER — Encounter (INDEPENDENT_AMBULATORY_CARE_PROVIDER_SITE_OTHER): Payer: Self-pay | Admitting: General Surgery

## 2013-10-23 VITALS — BP 120/60 | HR 68 | Temp 97.0°F | Resp 14 | Ht 70.0 in | Wt 253.0 lb

## 2013-10-23 DIAGNOSIS — K6289 Other specified diseases of anus and rectum: Secondary | ICD-10-CM

## 2013-10-23 NOTE — Progress Notes (Signed)
Chief Complaint  Patient presents with  . New Evaluation    p/o a seton, pt still having leakage    HISTORY: Cameron Petty is a 57 y.o. male who presents to the office with rectal pain.  Other symptoms include burning, itching and drainage.  This had been occurring since seton removal.  He has tried pain pills in the past with good success.  Hard stools makes the symptoms worse.   It is intermittent in nature.  Her bowel habits are regular and his bowel movements are occasionally hard, when he is off his stool softeners.  He denies any leakage.  His fiber intake is dietary.  His last colonoscopy was about 2 yrs ago.  He states one polyp was found.  He is due again soon, he thinks       Past Medical History  Diagnosis Date  . Hypertension   . Stroke 2007    mini stroke, no residule now  . Myocardial infarction 2007      Past Surgical History  Procedure Laterality Date  . Gallbladder surg in 2012    . Incision and drainage perirectal abscess  08/11/2012    Procedure: IRRIGATION AND DEBRIDEMENT PERIRECTAL ABSCESS;  Surgeon: Robyne Askew, MD;  Location: [facility] OR;  Service: General;  Laterality: N/A;  . Cardiac catheterization  2007    stents placed  . Incision and drainage abscess  11/16/2012    Procedure: INCISION AND DRAINAGE ABSCESS;  Surgeon: Robyne Askew, MD;  Location: [facility] OR;  Service: General;  Laterality: N/A;  incision and drainage perineal abscess        Current Outpatient Prescriptions  Medication Sig Dispense Refill  . aspirin EC 81 MG tablet Take 81 mg by mouth daily.      Marland Kitchen atorvastatin (LIPITOR) 40 MG tablet Take 40 mg by mouth daily.      . carvedilol (COREG) 3.125 MG tablet Take 3.125 mg by mouth 2 (two) times daily with a meal.      . clopidogrel (PLAVIX) 75 MG tablet Take 75 mg by mouth daily.      . diphenhydrAMINE (BENADRYL) 25 MG tablet Take 1 tablet (25 mg total) by mouth every 6 (six) hours as needed for itching (Rash).  30 tablet  0  . fexofenadine  (ALLEGRA) 60 MG tablet Take 1 tablet (60 mg total) by mouth 2 (two) times daily.  30 tablet  2  . hydrOXYzine (ATARAX/VISTARIL) 25 MG tablet Take 1 tablet (25 mg total) by mouth every 6 (six) hours.  20 tablet  0  . lisinopril (PRINIVIL,ZESTRIL) 10 MG tablet Take 10 mg by mouth daily.      Marland Kitchen oxyCODONE-acetaminophen (ROXICET) 5-325 MG per tablet Take 1 tablet by mouth every 6 (six) hours as needed for pain.  40 tablet  0  . triamcinolone cream (KENALOG) 0.1 % Apply topically 2 (two) times daily.  30 g  0   No current facility-administered medications for this visit.      Allergies  Allergen Reactions  . Penicillins Rash      Family History  Problem Relation Age of Onset  . Heart disease Brother   . Cancer Maternal Uncle     breast    History   Social History  . Marital Status: Divorced    Spouse Name: N/A    Number of Children: N/A  . Years of Education: N/A   Social History Main Topics  . Smoking status: Current Every Day Smoker --  0.25 packs/day for 30 years    Last Attempt to Quit: 05/16/2012  . Smokeless tobacco: Never Used  . Alcohol Use: No     Comment: occasionally  . Drug Use: No  . Sexual Activity: None   Other Topics Concern  . None   Social History Narrative  . None      REVIEW OF SYSTEMS - PERTINENT POSITIVES ONLY: Review of Systems - General ROS: negative for - chills, fever or weight loss Hematological and Lymphatic ROS: negative for - bleeding problems, blood clots or bruising Respiratory ROS: no cough, shortness of breath, or wheezing Cardiovascular ROS: no chest pain or dyspnea on exertion Gastrointestinal ROS: no abdominal pain, change in bowel habits, or black or bloody stools Genito-Urinary ROS: no dysuria, trouble voiding, or hematuria  EXAM: Filed Vitals:   10/23/13 1037  BP: 120/60  Pulse: 68  Temp: 97 F (36.1 C)  Resp: 14    General appearance: alert and cooperative Resp: clear to auscultation bilaterally Cardio: regular rate  and rhythm GI: normal findings: soft, non-tender  Anal Exam Findings: posterior sphincter defect, minimal rectal tone, good squeeze, no fistula noted.     ASSESSMENT AND PLAN: Cameron Petty is a 57 y.o. 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  He is still having some symptoms of anal pain, burning and leakage.  I suspect this is due to scarring and chronic inflammation from the seton. Hopefully this will resolve with time.  I have recommended close attention to his bowel regimen to avoid constipation and diarrhea.  He should use sitz baths and lidocaine for acute anal pain and stop all narcotics.  He should start a fiber supplement daily with plenty of liquids.  He will call the office if his symptoms worsen or fail to improve over the next few months.     Vanita Panda, MD Colon and Rectal Surgery / General Surgery [facility]      Visit Diagnoses: 1. Anal pain     Primary Care Physician: Robynn Pane, MD

## 2013-10-23 NOTE — Patient Instructions (Signed)

## 2013-12-19 ENCOUNTER — Emergency Department (HOSPITAL_COMMUNITY)
Admission: EM | Admit: 2013-12-19 | Discharge: 2013-12-19 | Disposition: A | Discharge status: Home / Self Care | Payer: Medicaid Other | Attending: Emergency Medicine | Admitting: Emergency Medicine

## 2013-12-19 ENCOUNTER — Encounter (HOSPITAL_COMMUNITY): Payer: Self-pay | Admitting: Emergency Medicine

## 2013-12-19 DIAGNOSIS — F17200 Nicotine dependence, unspecified, uncomplicated: Secondary | ICD-10-CM | POA: Insufficient documentation

## 2013-12-19 DIAGNOSIS — L259 Unspecified contact dermatitis, unspecified cause: Secondary | ICD-10-CM | POA: Insufficient documentation

## 2013-12-19 DIAGNOSIS — Z880 Allergy status to penicillin: Secondary | ICD-10-CM | POA: Insufficient documentation

## 2013-12-19 DIAGNOSIS — Z8673 Personal history of transient ischemic attack (TIA), and cerebral infarction without residual deficits: Secondary | ICD-10-CM | POA: Insufficient documentation

## 2013-12-19 DIAGNOSIS — I10 Essential (primary) hypertension: Secondary | ICD-10-CM | POA: Insufficient documentation

## 2013-12-19 DIAGNOSIS — L309 Dermatitis, unspecified: Secondary | ICD-10-CM

## 2013-12-19 DIAGNOSIS — Z7902 Long term (current) use of antithrombotics/antiplatelets: Secondary | ICD-10-CM | POA: Insufficient documentation

## 2013-12-19 DIAGNOSIS — Z79899 Other long term (current) drug therapy: Secondary | ICD-10-CM | POA: Insufficient documentation

## 2013-12-19 DIAGNOSIS — Z7982 Long term (current) use of aspirin: Secondary | ICD-10-CM | POA: Insufficient documentation

## 2013-12-19 DIAGNOSIS — IMO0002 Reserved for concepts with insufficient information to code with codable children: Secondary | ICD-10-CM | POA: Insufficient documentation

## 2013-12-19 DIAGNOSIS — I252 Old myocardial infarction: Secondary | ICD-10-CM | POA: Insufficient documentation

## 2013-12-19 MED ORDER — DEXAMETHASONE SODIUM PHOSPHATE 10 MG/ML IJ SOLN
10.0000 mg | Freq: Once | INTRAMUSCULAR | Status: AC
  Administered 2013-12-19: 10 mg via INTRAMUSCULAR
  Filled 2013-12-19: qty 1

## 2013-12-19 MED ORDER — TRIAMCINOLONE ACETONIDE 0.025 % EX OINT: 1.0000 "application " | TOPICAL_OINTMENT | Freq: Two times a day (BID) | CUTANEOUS | Status: DC

## 2013-12-19 MED ORDER — FEXOFENADINE HCL 60 MG PO TABS: 60.0000 mg | ORAL_TABLET | Freq: Two times a day (BID) | ORAL | Status: DC

## 2013-12-19 MED ORDER — HYDROXYZINE HCL 25 MG PO TABS: 25.0000 mg | ORAL_TABLET | Freq: Three times a day (TID) | ORAL | Status: DC | PRN

## 2013-12-19 NOTE — ED Provider Notes (Signed)
CSN: [id number]     Arrival date & time 12/19/13  1702 History  This chart was scribed for non-physician practitioner Cameron Forth, PA-C, working with Cameron Jakes, MD by Cameron Petty, ED Scribe. This patient was seen in room TR06C/TR06C and the patient's care was started at 1702.      Chief Complaint  Patient presents with  . Rash    HPI  HPI Comments: Cameron Petty is a 57 y.o. male who presents to the Emergency Department complaining of a new episode of a gradual onset, constant, gradually worsening rash to the bilateral arms, shoulders, abdomen, back and scalp that began 1 week ago. Pt states he has had similar symptoms in the past and has been treated with prednisone. He states being seen for this several times in the past. He states he has not been able to follow up with a dermatologist but he does have an appointment in January of next year. He has tried all kinds of OTC remedies without relief. He denies fever, chills, nausea, vomiting, SOB.   Past Medical History  Diagnosis Date  . Hypertension   . Stroke 2007    mini stroke, no residule now  . Myocardial infarction 2007   Past Surgical History  Procedure Laterality Date  . Gallbladder surg in 2012    . Incision and drainage perirectal abscess  08/11/2012    Procedure: IRRIGATION AND DEBRIDEMENT PERIRECTAL ABSCESS;  Surgeon: Cameron Askew, MD;  Location: [facility] OR;  Service: General;  Laterality: N/A;  . Cardiac catheterization  2007    stents placed  . Incision and drainage abscess  11/16/2012    Procedure: INCISION AND DRAINAGE ABSCESS;  Surgeon: Cameron Askew, MD;  Location: [facility] OR;  Service: General;  Laterality: N/A;  incision and drainage perineal abscess   Family History  Problem Relation Age of Onset  . Heart disease Brother   . Cancer Maternal Uncle     breast   History  Substance Use Topics  . Smoking status: Current Every Day Smoker -- 0.25 packs/day for 30 years    Last Attempt to Quit:  05/16/2012  . Smokeless tobacco: Never Used  . Alcohol Use: No     Comment: occasionally    Review of Systems  Constitutional: Negative for fever and chills.  Respiratory: Negative for shortness of breath.   Cardiovascular: Negative for chest pain.  Gastrointestinal: Negative for nausea and vomiting.  Endocrine: Negative for polydipsia, polyphagia and polyuria.  Skin: Positive for rash.  Allergic/Immunologic: Negative for immunocompromised state.  Neurological: Negative for headaches.  Hematological: Does not bruise/bleed easily.    Allergies  Penicillins  Home Medications   Current Outpatient Rx  Name  Route  Sig  Dispense  Refill  . aspirin EC 81 MG tablet   Oral   Take 81 mg by mouth daily.         Marland Kitchen atorvastatin (LIPITOR) 40 MG tablet   Oral   Take 40 mg by mouth daily.         . carvedilol (COREG) 3.125 MG tablet   Oral   Take 3.125 mg by mouth 2 (two) times daily with a meal.         . clopidogrel (PLAVIX) 75 MG tablet   Oral   Take 75 mg by mouth daily.         . diphenhydrAMINE (BENADRYL) 25 MG tablet   Oral   Take 1 tablet (25 mg total) by mouth every  6 (six) hours as needed for itching (Rash).   30 tablet   0   . fexofenadine (ALLEGRA) 60 MG tablet   Oral   Take 1 tablet (60 mg total) by mouth 2 (two) times daily.   30 tablet   2   . fexofenadine (ALLEGRA) 60 MG tablet   Oral   Take 1 tablet (60 mg total) by mouth 2 (two) times daily.   30 tablet   3   . hydrOXYzine (ATARAX/VISTARIL) 25 MG tablet   Oral   Take 1 tablet (25 mg total) by mouth every 6 (six) hours.   20 tablet   0   . hydrOXYzine (ATARAX/VISTARIL) 25 MG tablet   Oral   Take 1 tablet (25 mg total) by mouth every 8 (eight) hours as needed.   12 tablet   0   . lisinopril (PRINIVIL,ZESTRIL) 10 MG tablet   Oral   Take 10 mg by mouth daily.         Marland Kitchen oxyCODONE-acetaminophen (ROXICET) 5-325 MG per tablet   Oral   Take 1 tablet by mouth every 6 (six) hours as  needed for pain.   40 tablet   0   . triamcinolone (KENALOG) 0.025 % ointment   Topical   Apply 1 application topically 2 (two) times daily.   30 g   0   . triamcinolone cream (KENALOG) 0.1 %   Topical   Apply topically 2 (two) times daily.   30 g   0    BP 131/75  Pulse 125  Temp(Src) 97.8 F (36.6 C) (Oral)  Resp 20  SpO2 100% Physical Exam  Nursing note and vitals reviewed. Constitutional: He appears well-developed and well-nourished. No distress.  Awake, alert, nontoxic appearance  HENT:  Head: Normocephalic and atraumatic.  Mouth/Throat: Oropharynx is clear and moist. No oropharyngeal exudate.  Eyes: Conjunctivae are normal. No scleral icterus.  Neck: Normal range of motion. Neck supple.  Cardiovascular: Normal rate, regular rhythm and intact distal pulses.   Pulmonary/Chest: Effort normal and breath sounds normal. No respiratory distress. He has no wheezes.  Abdominal: Soft. Bowel sounds are normal. He exhibits no mass. There is no tenderness. There is no rebound and no guarding.  Musculoskeletal: Normal range of motion. He exhibits no edema.  Neurological: He is alert.  Speech is clear and goal oriented Moves extremities without ataxia  Skin: Skin is warm and dry. Rash noted. He is not diaphoretic. No erythema.  Patches of dry and scaling areas in the flexural folds of the bilateral arms. Noted across the abdomen and over the entirety of the back. All areas are excoriated but there is no induration or evidence of abscess.   Psychiatric: He has a normal mood and affect.    ED Course  Procedures   DIAGNOSTIC STUDIES: Oxygen Saturation is 96% on RA, adequate by my interpretation.    COORDINATION OF CARE: 7:03 PM Discussed treatment plan with pt at bedside and pt agreed to plan.   Labs Review Labs Reviewed - No data to display Imaging Review No results found.  EKG Interpretation   None       MDM   1. Eczema     Cameron Petty presents with Hx  of allergies and persistent, waxing and waning rash in the flexural folds of the upper extremities, abdomen and back for many years.  Hx and PE consistent with Eczema.  Pt instructed to begin antihistamine such as allegra which he has taken before,  avoid hot baths/showers, use Eucerin or Aquaphor after every bath/shower and preferably BID, drink plenty of fluids and use triamcinolone cream on the rash for itching.  Pt given Rx for triamcinolone and vistaril.  Recommend f/u with PCP and or [facility].  I have also discussed reasons to return immediately to the ER.  Patient expresses understanding and agrees with plan.  It has been determined that no acute conditions requiring further emergency intervention are present at this time. The patient/guardian have been advised of the diagnosis and plan. We have discussed signs and symptoms that warrant return to the ED, such as changes or worsening in symptoms.   Vital signs are stable at discharge.   BP 131/75  Pulse 77  Temp(Src) 97.8 F (36.6 C) (Oral)  Resp 20  SpO2 100%  Patient/guardian has voiced understanding and agreed to follow-up with the PCP or specialist.    I personally performed the services described in this documentation, which was scribed in my presence. The recorded information has been reviewed and is accurate.      Cameron Forth, PA-C 12/19/13 1928

## 2013-12-19 NOTE — ED Notes (Signed)
Reports itching rash to upper torso and arms, has been seen 6-7 times for it, has been unable to follow up with dermatologist. No acute distress noted at triage.

## 2013-12-24 NOTE — ED Provider Notes (Signed)
Medical screening examination/treatment/procedure(s) were performed by non-physician practitioner and as supervising physician I was immediately available for consultation/collaboration.  EKG Interpretation   None         Naevia Unterreiner W. Koji Niehoff, MD 12/24/13 0825 

## 2014-02-01 ENCOUNTER — Encounter (HOSPITAL_COMMUNITY): Payer: Self-pay | Admitting: Emergency Medicine

## 2014-02-01 ENCOUNTER — Emergency Department (HOSPITAL_COMMUNITY)
Admission: EM | Admit: 2014-02-01 | Discharge: 2014-02-01 | Disposition: A | Discharge status: Home / Self Care | Payer: Medicaid Other | Attending: Emergency Medicine | Admitting: Emergency Medicine

## 2014-02-01 DIAGNOSIS — Z7982 Long term (current) use of aspirin: Secondary | ICD-10-CM | POA: Insufficient documentation

## 2014-02-01 DIAGNOSIS — Z9861 Coronary angioplasty status: Secondary | ICD-10-CM | POA: Insufficient documentation

## 2014-02-01 DIAGNOSIS — Z7902 Long term (current) use of antithrombotics/antiplatelets: Secondary | ICD-10-CM | POA: Insufficient documentation

## 2014-02-01 DIAGNOSIS — Z880 Allergy status to penicillin: Secondary | ICD-10-CM | POA: Insufficient documentation

## 2014-02-01 DIAGNOSIS — Z79899 Other long term (current) drug therapy: Secondary | ICD-10-CM | POA: Insufficient documentation

## 2014-02-01 DIAGNOSIS — I252 Old myocardial infarction: Secondary | ICD-10-CM | POA: Insufficient documentation

## 2014-02-01 DIAGNOSIS — F17200 Nicotine dependence, unspecified, uncomplicated: Secondary | ICD-10-CM | POA: Insufficient documentation

## 2014-02-01 DIAGNOSIS — I10 Essential (primary) hypertension: Secondary | ICD-10-CM | POA: Insufficient documentation

## 2014-02-01 DIAGNOSIS — H00019 Hordeolum externum unspecified eye, unspecified eyelid: Secondary | ICD-10-CM

## 2014-02-01 DIAGNOSIS — Z8673 Personal history of transient ischemic attack (TIA), and cerebral infarction without residual deficits: Secondary | ICD-10-CM | POA: Insufficient documentation

## 2014-02-01 MED ORDER — ERYTHROMYCIN 5 MG/GM OP OINT: TOPICAL_OINTMENT | OPHTHALMIC | Status: DC

## 2014-02-01 MED ORDER — HYDROXYZINE HCL 25 MG PO TABS: 25.0000 mg | ORAL_TABLET | Freq: Four times a day (QID) | ORAL | Status: DC

## 2014-02-01 MED ORDER — TETRACAINE HCL 0.5 % OP SOLN
1.0000 [drp] | Freq: Once | OPHTHALMIC | Status: AC
  Administered 2014-02-01: 1 [drp] via OPHTHALMIC
  Filled 2014-02-01: qty 2

## 2014-02-01 MED ORDER — TRIAMCINOLONE ACETONIDE 0.025 % EX OINT: 1.0000 "application " | TOPICAL_OINTMENT | Freq: Two times a day (BID) | CUTANEOUS | Status: DC

## 2014-02-01 MED ORDER — DEXAMETHASONE SODIUM PHOSPHATE 10 MG/ML IJ SOLN
10.0000 mg | Freq: Once | INTRAMUSCULAR | Status: AC
  Administered 2014-02-01: 10 mg via INTRAMUSCULAR
  Filled 2014-02-01: qty 1

## 2014-02-01 MED ORDER — FLUORESCEIN SODIUM 1 MG OP STRP
1.0000 | ORAL_STRIP | Freq: Once | OPHTHALMIC | Status: AC
  Administered 2014-02-01: 1 via OPHTHALMIC
  Filled 2014-02-01: qty 1

## 2014-02-01 NOTE — ED Provider Notes (Signed)
CSN: [id number]     Arrival date & time 02/01/14  0927 History   First MD Initiated Contact with Patient 02/01/14 [phone]     Chief Complaint  Patient presents with  . blood in eye    (Consider location/radiation/quality/duration/timing/severity/associated sxs/prior Treatment) HPI Comments: Patient presents to the emergency department with chief complaint of right lower eyelid swelling x2 weeks. He states that the swelling and pain have been gradually worsening. Last night, he states that he woke to use the bathroom, and noticed that he had some discharge from the eyelid, which he describes as bloody. He denies fevers or chills. Denies pain with eye movement. He states that he has had some blurred vision and increased lacrimation.  States that his pain is moderate.  He has not tried anything to alleviate his symptoms. Additionally, the patient states that his eczema has been flaring up.  The history is provided by the patient. No language interpreter was used.    Past Medical History  Diagnosis Date  . Hypertension   . Stroke 2007    mini stroke, no residule now  . Myocardial infarction 2007   Past Surgical History  Procedure Laterality Date  . Gallbladder surg in 2012    . Incision and drainage perirectal abscess  08/11/2012    Procedure: IRRIGATION AND DEBRIDEMENT PERIRECTAL ABSCESS;  Surgeon: Robyne AskewPaul S Toth III, MD;  [facility];  Service: General;  Laterality: N/A;  . Cardiac catheterization  2007    stents placed  . Incision and drainage abscess  11/16/2012    Procedure: INCISION AND DRAINAGE ABSCESS;  Surgeon: Robyne AskewPaul S Toth III, MD;  Location: [facility] OR;  Service: General;  Laterality: N/A;  incision and drainage perineal abscess   Family History  Problem Relation Age of Onset  . Heart disease Brother   . Cancer Maternal Uncle     breast   History  Substance Use Topics  . Smoking status: Current Every Day Smoker -- 0.50 packs/day for 30 years    Types: Cigarettes  . Smokeless  tobacco: Never Used  . Alcohol Use: No     Comment: occasionally    Review of Systems  All other systems reviewed and are negative.    Allergies  Penicillins  Home Medications   Current Outpatient Rx  Name  Route  Sig  Dispense  Refill  . aspirin EC 81 MG tablet   Oral   Take 81 mg by mouth daily.         Marland Kitchen. atorvastatin (LIPITOR) 40 MG tablet   Oral   Take 40 mg by mouth daily.         . carvedilol (COREG) 3.125 MG tablet   Oral   Take 3.125 mg by mouth 2 (two) times daily with a meal.         . clopidogrel (PLAVIX) 75 MG tablet   Oral   Take 75 mg by mouth daily.         . diphenhydrAMINE (BENADRYL) 25 MG tablet   Oral   Take 1 tablet (25 mg total) by mouth every 6 (six) hours as needed for itching (Rash).   30 tablet   0   . fexofenadine (ALLEGRA) 60 MG tablet   Oral   Take 1 tablet (60 mg total) by mouth 2 (two) times daily.   30 tablet   2   . fexofenadine (ALLEGRA) 60 MG tablet   Oral   Take 1 tablet (60 mg total) by mouth 2 (  two) times daily.   30 tablet   3   . hydrOXYzine (ATARAX/VISTARIL) 25 MG tablet   Oral   Take 1 tablet (25 mg total) by mouth every 6 (six) hours.   20 tablet   0   . hydrOXYzine (ATARAX/VISTARIL) 25 MG tablet   Oral   Take 1 tablet (25 mg total) by mouth every 8 (eight) hours as needed.   12 tablet   0   . lisinopril (PRINIVIL,ZESTRIL) 10 MG tablet   Oral   Take 10 mg by mouth daily.         Marland Kitchen oxyCODONE-acetaminophen (ROXICET) 5-325 MG per tablet   Oral   Take 1 tablet by mouth every 6 (six) hours as needed for pain.   40 tablet   0   . triamcinolone (KENALOG) 0.025 % ointment   Topical   Apply 1 application topically 2 (two) times daily.   30 g   0   . triamcinolone cream (KENALOG) 0.1 %   Topical   Apply topically 2 (two) times daily.   30 g   0    BP 145/80  Pulse 80  Temp(Src) 97.7 F (36.5 C) (Oral)  Resp 18  Ht 5\' 10"  (1.778 m)  Wt 255 lb (115.667 kg)  BMI 36.59 kg/m2  SpO2  98% Physical Exam  Nursing note and vitals reviewed. Constitutional: He is oriented to person, place, and time. He appears well-developed and well-nourished.  HENT:  Head: Normocephalic and atraumatic.  Eyes: Conjunctivae and EOM are normal.  Visual Acuity - Bilateral Near: 20/50 ; R Near: 20/50 ; L Near: 20/70 No fluorescein uptake, no foreign body, eyelid is inverted, no pain with eye movement  Neck: Normal range of motion.  Cardiovascular: Normal rate.   Pulmonary/Chest: Effort normal.  Abdominal: He exhibits no distension.  Musculoskeletal: Normal range of motion.  Neurological: He is alert and oriented to person, place, and time.  Skin: Skin is dry.  Psychiatric: He has a normal mood and affect. His behavior is normal. Judgment and thought content normal.    ED Course  Procedures (including critical care time)    EKG Interpretation   None       MDM   1. Stye    Patient with stye. No acute visual change. No corneal abrasion. No evidence of entrapment. No fever. Will discharge with erythromycin ointment, and ophthalmology followup. Discussed patient with Dr. Elesa Massed, who agrees with the plan.  Will also treat the patient's eczema.  Roxy Horseman, PA-C 02/01/14 1040

## 2014-02-01 NOTE — ED Notes (Signed)
Pt undressed, in gown, continuous bp cuff and pulse ox 

## 2014-02-01 NOTE — Discharge Instructions (Signed)
Sty A sty (hordeolum) is an infection of a gland in the eyelid located at the base of the eyelash. A sty may develop a white or yellow head of pus. It can be puffy (swollen). Usually, the sty will burst and pus will come out on its own. They do not leave lumps in the eyelid once they drain. A sty is often confused with another form of cyst of the eyelid called a chalazion. Chalazions occur within the eyelid and not on the edge where the bases of the eyelashes are. They often are red, sore and then form firm lumps in the eyelid. CAUSES   Germs (bacteria).  Lasting (chronic) eyelid inflammation. SYMPTOMS   Tenderness, redness and swelling along the edge of the eyelid at the base of the eyelashes.  Sometimes, there is a white or yellow head of pus. It may or may not drain. DIAGNOSIS  An ophthalmologist will be able to distinguish between a sty and a chalazion and treat the condition appropriately.  TREATMENT   Styes are typically treated with warm packs (compresses) until drainage occurs.  In rare cases, medicines that kill germs (antibiotics) may be prescribed. These antibiotics may be in the form of drops, cream or pills.  If a hard lump has formed, it is generally necessary to do a small incision and remove the hardened contents of the cyst in a minor surgical procedure done in the office.  In suspicious cases, your caregiver may send the contents of the cyst to the lab to be certain that it is not a rare, but dangerous form of cancer of the glands of the eyelid. HOME CARE INSTRUCTIONS   Wash your hands often and dry them with a clean towel. Avoid touching your eyelid. This may spread the infection to other parts of the eye.  Apply heat to your eyelid for 10 to 20 minutes, several times a day, to ease pain and help to heal it faster.  Do not squeeze the sty. Allow it to drain on its own. Wash your eyelid carefully 3 to 4 times per day to remove any pus. SEEK IMMEDIATE MEDICAL CARE IF:     Your eye becomes painful or puffy (swollen).  Your vision changes.  Your sty does not drain by itself within 3 days.  Your sty comes back within a short period of time, even with treatment.  You have redness (inflammation) around the eye.  You have a fever. Document Released: 09/22/2005 Document Revised: 03/06/2012 Document Reviewed: 05/27/2009 [facility] Patient Information 2014 [place], [place]. Eczema Eczema, also called atopic dermatitis, is a skin disorder that causes inflammation of the skin. It causes a red rash and dry, scaly skin. The skin becomes very itchy. Eczema is generally worse during the cooler winter months and often improves with the warmth of summer. Eczema usually starts showing signs in infancy. Some children outgrow eczema, but it may last through adulthood.  CAUSES  The exact cause of eczema is not known, but it appears to run in families. People with eczema often have a family history of eczema, allergies, asthma, or hay fever. Eczema is not contagious. Flare-ups of the condition may be caused by:   Contact with something you are sensitive or allergic to.   Stress. SIGNS AND SYMPTOMS  Dry, scaly skin.   Red, itchy rash.   Itchiness. This may occur before the skin rash and may be very intense.  DIAGNOSIS  The diagnosis of eczema is usually made based on symptoms and medical  history. TREATMENT  Eczema cannot be cured, but symptoms usually can be controlled with treatment and other strategies. A treatment plan might include:  Controlling the itching and scratching.   Use over-the-counter antihistamines as directed for itching. This is especially useful at night when the itching tends to be worse.   Use over-the-counter steroid creams as directed for itching.   Avoid scratching. Scratching makes the rash and itching worse. It may also result in a skin infection (impetigo) due to a break in the skin caused by scratching.   Keeping the skin well  moisturized with creams every day. This will seal in moisture and help prevent dryness. Lotions that contain alcohol and water should be avoided because they can dry the skin.   Limiting exposure to things that you are sensitive or allergic to (allergens).   Recognizing situations that cause stress.   Developing a plan to manage stress.  HOME CARE INSTRUCTIONS   Only take over-the-counter or prescription medicines as directed by your health care provider.   Do not use anything on the skin without checking with your health care provider.   Keep baths or showers short (5 minutes) in warm (not hot) water. Use mild cleansers for bathing. These should be unscented. You may add nonperfumed bath oil to the bath water. It is best to avoid soap and bubble bath.   Immediately after a bath or shower, when the skin is still damp, apply a moisturizing ointment to the entire body. This ointment should be a petroleum ointment. This will seal in moisture and help prevent dryness. The thicker the ointment, the better. These should be unscented.   Keep fingernails cut short. Children with eczema may need to wear soft gloves or mittens at night after applying an ointment.   Dress in clothes made of cotton or cotton blends. Dress lightly, because heat increases itching.   A child with eczema should stay away from anyone with fever blisters or cold sores. The virus that causes fever blisters (herpes simplex) can cause a serious skin infection in children with eczema. SEEK MEDICAL CARE IF:   Your itching interferes with sleep.   Your rash gets worse or is not better within 1 week after starting treatment.   You see pus or soft yellow scabs in the rash area.   You have a fever.   You have a rash flare-up after contact with someone who has fever blisters.  Document Released: 12/10/2000 Document Revised: 10/03/2013 Document Reviewed: 07/16/2013 [facility] Patient Information 2014 [place]

## 2014-02-01 NOTE — ED Provider Notes (Signed)
Medical screening examination/treatment/procedure(s) were performed by non-physician practitioner and as supervising physician I was immediately available for consultation/collaboration.  EKG Interpretation   None         Kristen N Ward, DO 02/01/14 1454 

## 2014-02-01 NOTE — ED Notes (Signed)
Patient states he started having pain and itching in R eye x 2 wks ago.  He advised that he woke up this morning and stated eye was a little more swollen with blood when he pulled down bottom eyelid.

## 2014-06-10 ENCOUNTER — Emergency Department (HOSPITAL_COMMUNITY): Payer: Medicaid Other

## 2014-06-10 ENCOUNTER — Emergency Department (HOSPITAL_COMMUNITY)
Admission: EM | Admit: 2014-06-10 | Discharge: 2014-06-10 | Disposition: A | Discharge status: Home / Self Care | Payer: Medicaid Other | Attending: Emergency Medicine | Admitting: Emergency Medicine

## 2014-06-10 ENCOUNTER — Other Ambulatory Visit: Payer: Self-pay

## 2014-06-10 DIAGNOSIS — Z8673 Personal history of transient ischemic attack (TIA), and cerebral infarction without residual deficits: Secondary | ICD-10-CM | POA: Insufficient documentation

## 2014-06-10 DIAGNOSIS — IMO0002 Reserved for concepts with insufficient information to code with codable children: Secondary | ICD-10-CM | POA: Insufficient documentation

## 2014-06-10 DIAGNOSIS — R0789 Other chest pain: Secondary | ICD-10-CM | POA: Insufficient documentation

## 2014-06-10 DIAGNOSIS — I10 Essential (primary) hypertension: Secondary | ICD-10-CM | POA: Insufficient documentation

## 2014-06-10 DIAGNOSIS — F17200 Nicotine dependence, unspecified, uncomplicated: Secondary | ICD-10-CM | POA: Insufficient documentation

## 2014-06-10 DIAGNOSIS — Z880 Allergy status to penicillin: Secondary | ICD-10-CM | POA: Insufficient documentation

## 2014-06-10 DIAGNOSIS — Z9889 Other specified postprocedural states: Secondary | ICD-10-CM | POA: Insufficient documentation

## 2014-06-10 DIAGNOSIS — Z79899 Other long term (current) drug therapy: Secondary | ICD-10-CM | POA: Insufficient documentation

## 2014-06-10 DIAGNOSIS — I252 Old myocardial infarction: Secondary | ICD-10-CM | POA: Insufficient documentation

## 2014-06-10 DIAGNOSIS — I2510 Atherosclerotic heart disease of native coronary artery without angina pectoris: Secondary | ICD-10-CM | POA: Insufficient documentation

## 2014-06-10 DIAGNOSIS — Z7902 Long term (current) use of antithrombotics/antiplatelets: Secondary | ICD-10-CM | POA: Insufficient documentation

## 2014-06-10 DIAGNOSIS — Z7982 Long term (current) use of aspirin: Secondary | ICD-10-CM | POA: Insufficient documentation

## 2014-06-10 DIAGNOSIS — R071 Chest pain on breathing: Secondary | ICD-10-CM | POA: Insufficient documentation

## 2014-06-10 LAB — BASIC METABOLIC PANEL
BUN: 7 mg/dL (ref 6–23)
CALCIUM: 8.8 mg/dL (ref 8.4–10.5)
CO2: 25 meq/L (ref 19–32)
Chloride: 102 mEq/L (ref 96–112)
Creatinine, Ser: 1.06 mg/dL (ref 0.50–1.35)
GFR calc Af Amer: 88 mL/min — ABNORMAL LOW (ref >90)
GFR, EST NON AFRICAN AMERICAN: 76 mL/min — AB (ref >90)
Glucose, Bld: 106 mg/dL — ABNORMAL HIGH (ref 70–99)
Potassium: 3.4 mEq/L — ABNORMAL LOW (ref 3.7–5.3)
SODIUM: 142 meq/L (ref 137–147)

## 2014-06-10 LAB — I-STAT TROPONIN, ED
TROPONIN I, POC: 0 ng/mL (ref 0.00–0.08)
Troponin i, poc: 0 ng/mL (ref 0.00–0.08)

## 2014-06-10 LAB — CBC
HCT: 34.4 % — ABNORMAL LOW (ref 39.0–52.0)
Hemoglobin: 11.2 g/dL — ABNORMAL LOW (ref 13.0–17.0)
MCH: 27.4 pg (ref 26.0–34.0)
MCHC: 32.6 g/dL (ref 30.0–36.0)
MCV: 84.1 fL (ref 78.0–100.0)
PLATELETS: 339 10*3/uL (ref 150–400)
RBC: 4.09 MIL/uL — AB (ref 4.22–5.81)
RDW: 15.5 % (ref 11.5–15.5)
WBC: 9.3 10*3/uL (ref 4.0–10.5)

## 2014-06-10 MED ORDER — NITROGLYCERIN 0.4 MG SL SUBL
0.4000 mg | SUBLINGUAL_TABLET | SUBLINGUAL | Status: DC | PRN
  Administered 2014-06-10: 0.4 mg via SUBLINGUAL
  Filled 2014-06-10: qty 1

## 2014-06-10 NOTE — ED Provider Notes (Signed)
CSN: [id number]     Arrival date & time 06/10/14  0407 History   First MD Initiated Contact with Patient 06/10/14 0426     Chief Complaint  Patient presents with  . Chest Pain     (Consider location/radiation/quality/duration/timing/severity/associated sxs/prior Treatment) HPI  This patient is a 58 yo man with a history of HTN, stroke, CAD and MI. He presents with complaints of intermittent, stabbing left sided chest pain x 3 days. No exertional component. No SOB. No cough or fever. No GI sx. Pain is mild when it comes on. Patient pain free at time of evaluation. Patient had NTG pta. No abdominal pain.   Past Medical History  Diagnosis Date  . Hypertension   . Stroke 2007    mini stroke, no residule now  . Myocardial infarction 2007   Past Surgical History  Procedure Laterality Date  . Gallbladder surg in 2012    . Incision and drainage perirectal abscess  08/11/2012    Procedure: IRRIGATION AND DEBRIDEMENT PERIRECTAL ABSCESS;  Surgeon: Robyne AskewPaul S Toth III, MD;  [facility];  Service: General;  Laterality: N/A;  . Cardiac catheterization  2007    stents placed  . Incision and drainage abscess  11/16/2012    Procedure: INCISION AND DRAINAGE ABSCESS;  Surgeon: Robyne AskewPaul S Toth III, MD;  Location: [facility] OR;  Service: General;  Laterality: N/A;  incision and drainage perineal abscess   Family History  Problem Relation Age of Onset  . Heart disease Brother   . Cancer Maternal Uncle     breast   History  Substance Use Topics  . Smoking status: Current Every Day Smoker -- 0.50 packs/day for 30 years    Types: Cigarettes  . Smokeless tobacco: Never Used  . Alcohol Use: No     Comment: occasionally    Review of Systems Ten point review of symptoms performed and is negative with the exception of symptoms noted above.    Allergies  Penicillins  Home Medications   Prior to Admission medications   Medication Sig Start Date End Date Taking? Authorizing Provider  aspirin EC 81 MG  tablet Take 81 mg by mouth daily.   Yes Historical Provider, MD  atorvastatin (LIPITOR) 40 MG tablet Take 40 mg by mouth daily.   Yes Historical Provider, MD  carvedilol (COREG) 3.125 MG tablet Take 3.125 mg by mouth 2 (two) times daily with a meal.   Yes Historical Provider, MD  clopidogrel (PLAVIX) 75 MG tablet Take 75 mg by mouth daily.   Yes Historical Provider, MD  diphenhydrAMINE (BENADRYL) 25 MG tablet Take 1 tablet (25 mg total) by mouth every 6 (six) hours as needed for itching (Rash). 06/11/13  Yes Mora BellmanHannah S Merrell, PA-C  fexofenadine (ALLEGRA) 60 MG tablet Take 1 tablet (60 mg total) by mouth 2 (two) times daily. 09/16/13  Yes Tatyana A Kirichenko, PA-C  hydrOXYzine (ATARAX/VISTARIL) 25 MG tablet Take 1 tablet (25 mg total) by mouth every 6 (six) hours. 02/01/14  Yes Roxy Horsemanobert Browning, PA-C  lisinopril (PRINIVIL,ZESTRIL) 10 MG tablet Take 10 mg by mouth daily.   Yes Historical Provider, MD  triamcinolone (KENALOG) 0.025 % ointment Apply 1 application topically 2 (two) times daily. 02/01/14  Yes Roxy Horsemanobert Browning, PA-C   BP 129/62  Pulse 69  Temp(Src) 97.7 F (36.5 C) (Oral)  Resp 21  Ht 5\' 10"  (1.778 m)  Wt 247 lb (112.038 kg)  BMI 35.44 kg/m2  SpO2 99% Physical Exam Gen: well developed and well nourished appearing  Head: NCAT Eyes: PERL, EOMI Nose: no epistaixis or rhinorrhea Mouth/throat: mucosa is moist and pink Neck: supple, no stridor Chest: ttp over left chest wall with reproducible pain.  Lungs: CTA B, no wheezing, rhonchi or rales CV: RRR, no murmur, extremities appear well perfused.  Abd: soft, notender, nondistended Back: no ttp, no cva ttp Skin: warm and dry Ext: normal to inspection, no dependent edema Neuro: CN ii-xii grossly intact, no focal deficits Psyche; normal affect,  calm and cooperative.   ED Course  Procedures (including critical care time) Labs Review  Results for orders placed during the hospital encounter of 06/10/14 (from the past 24 hour(s))  CBC      Status: Abnormal   Collection Time    06/10/14  4:45 AM      Result Value Ref Range   WBC 9.3  4.0 - 10.5 K/uL   RBC 4.09 (*) 4.22 - 5.81 MIL/uL   Hemoglobin 11.2 (*) 13.0 - 17.0 g/dL   HCT 14.734.4 (*) 82.939.0 - 56.252.0 %   MCV 84.1  78.0 - 100.0 fL   MCH 27.4  26.0 - 34.0 pg   MCHC 32.6  30.0 - 36.0 g/dL   RDW 13.015.5  86.511.5 - 78.415.5 %   Platelets 339  150 - 400 K/uL  BASIC METABOLIC PANEL     Status: Abnormal   Collection Time    06/10/14  4:45 AM      Result Value Ref Range   Sodium 142  137 - 147 mEq/L   Potassium 3.4 (*) 3.7 - 5.3 mEq/L   Chloride 102  96 - 112 mEq/L   CO2 25  19 - 32 mEq/L   Glucose, Bld 106 (*) 70 - 99 mg/dL   BUN 7  6 - 23 mg/dL   Creatinine, Ser 6.961.06  0.50 - 1.35 mg/dL   Calcium 8.8  8.4 - 29.510.5 mg/dL   GFR calc non Af Amer 76 (*) >90 mL/min   GFR calc Af Amer 88 (*) >90 mL/min  I-STAT TROPOININ, ED     Status: None   Collection Time    06/10/14  4:53 AM      Result Value Ref Range   Troponin i, poc 0.00  0.00 - 0.08 ng/mL   Comment 3             Imaging Review Dg Chest Port 1 View  06/10/2014   CLINICAL DATA:  Left-sided chest pain and shortness of breath.  EXAM: PORTABLE CHEST - 1 VIEW  COMPARISON:  Chest radiograph January 05, 2013.  FINDINGS: Cardiomediastinal silhouette is unremarkable for this low inspiratory portable examination with crowded vasculature markings. The lungs are clear without pleural effusions or focal consolidations. Trachea projects midline and there is no pneumothorax. Included soft tissue planes and osseous structures are non-suspicious. Multiple EKG lines overlie the patient and may obscure subtle underlying pathology.  IMPRESSION: No acute cardiopulmonary process.   Electronically Signed   By: Awilda Metroourtnay  Bloomer   On: 06/10/2014 04:48    EKG: nsr, no acute ischemic changes, normal intervals, normal axis, normal qrs complex  MDM   Patient remains sx free. Pain is still reproducible with palpation. Atypical pain x 2 days in face of  normal EKG and troponin. The patient is stable for discharge with plan to f/u with Dr. Sharyn LullHarwani.      Brandt LoosenJulie Jairo Bellew, MD 06/10/14 [phone]

## 2014-06-10 NOTE — ED Notes (Signed)
Per EMS, the patient is from home calling for intermittent chest pain on left side for 2 days.  He chewed ASA prior to EMS arrival, and was given nitro X2 with immediate relief, then pain returned.  On the monitor in route, he was normal sinus with heart rate 87-90's.  Initial BP 200/100, but last BP reading was 125/80. Pulse 87.  Unremarkable EKG.  History of MI in 2007 and stroke.  Allergic to Penicillin.

## 2014-06-10 NOTE — ED Notes (Signed)
Walked blood samples to minilab.

## 2014-06-10 NOTE — Discharge Instructions (Signed)

## 2014-10-11 IMAGING — CR DG CHEST 1V PORT
1 series · 1 of 1 positions shown · non-contrast
Comparison: Chest radiograph January 05, 2013.

CLINICAL DATA: Left-sided chest pain and shortness of breath.

EXAM:
PORTABLE CHEST - 1 VIEW

[AP]
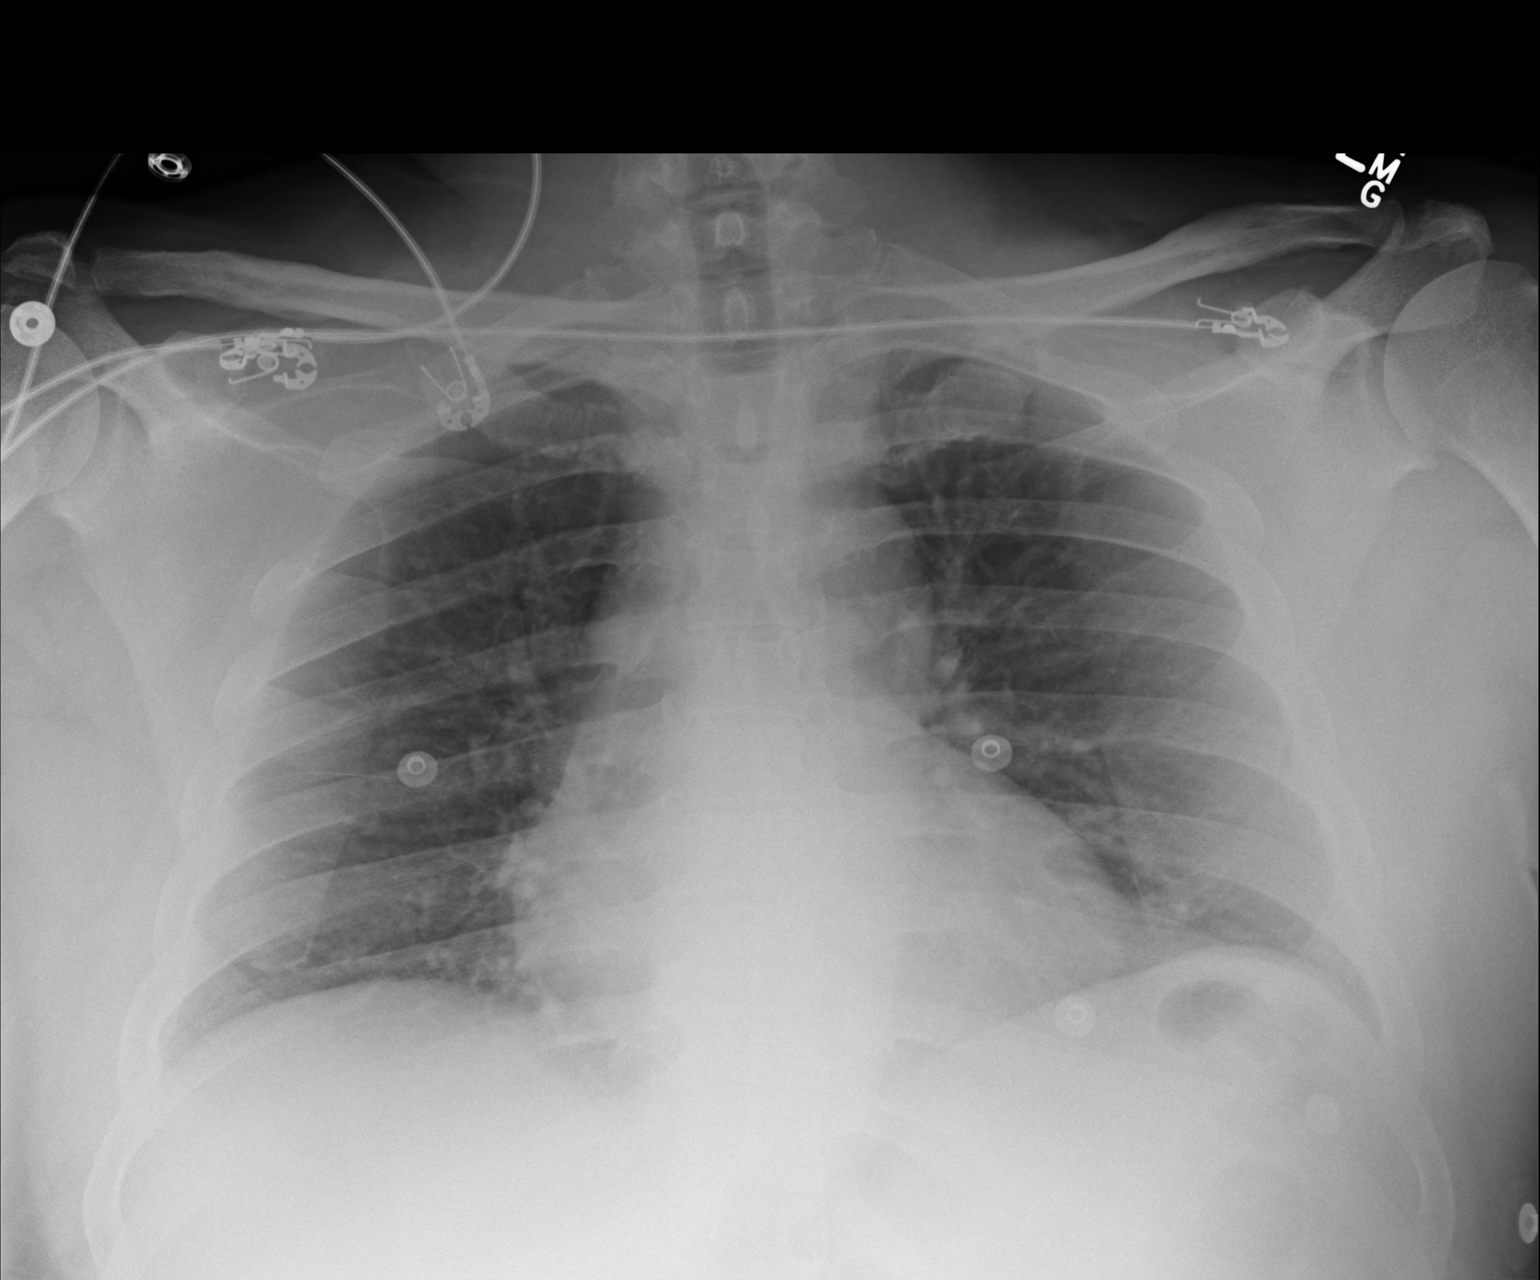

[1 of 1 positions shown; findings below may reference images not displayed]

FINDINGS: Cardiomediastinal silhouette is unremarkable for this low
inspiratory portable examination with crowded vasculature markings.
The lungs are clear without pleural effusions or focal
consolidations. Trachea projects midline and there is no
pneumothorax. Included soft tissue planes and osseous structures are
non-suspicious. Multiple EKG lines overlie the patient and may
obscure subtle underlying pathology.
IMPRESSION: No acute cardiopulmonary process.

  By: Fahmida Ds

## 2015-04-16 ENCOUNTER — Encounter (HOSPITAL_COMMUNITY): Payer: Self-pay | Admitting: Emergency Medicine

## 2015-04-16 ENCOUNTER — Other Ambulatory Visit (HOSPITAL_COMMUNITY): Payer: Self-pay

## 2015-04-16 ENCOUNTER — Inpatient Hospital Stay (HOSPITAL_COMMUNITY)
Admission: EM | Admit: 2015-04-16 | Discharge: 2015-04-18 | DRG: 246 | Disposition: A | Discharge status: Home / Self Care | Payer: Medicaid Other | Attending: Cardiology | Admitting: Cardiology

## 2015-04-16 DIAGNOSIS — I252 Old myocardial infarction: Secondary | ICD-10-CM | POA: Diagnosis not present

## 2015-04-16 DIAGNOSIS — R079 Chest pain, unspecified: Secondary | ICD-10-CM | POA: Diagnosis present

## 2015-04-16 DIAGNOSIS — E785 Hyperlipidemia, unspecified: Secondary | ICD-10-CM | POA: Diagnosis present

## 2015-04-16 DIAGNOSIS — I2510 Atherosclerotic heart disease of native coronary artery without angina pectoris: Secondary | ICD-10-CM | POA: Diagnosis present

## 2015-04-16 DIAGNOSIS — Z7982 Long term (current) use of aspirin: Secondary | ICD-10-CM

## 2015-04-16 DIAGNOSIS — Z880 Allergy status to penicillin: Secondary | ICD-10-CM | POA: Diagnosis not present

## 2015-04-16 DIAGNOSIS — Z8249 Family history of ischemic heart disease and other diseases of the circulatory system: Secondary | ICD-10-CM

## 2015-04-16 DIAGNOSIS — F17210 Nicotine dependence, cigarettes, uncomplicated: Secondary | ICD-10-CM | POA: Diagnosis present

## 2015-04-16 DIAGNOSIS — I10 Essential (primary) hypertension: Secondary | ICD-10-CM | POA: Diagnosis present

## 2015-04-16 DIAGNOSIS — I745 Embolism and thrombosis of iliac artery: Secondary | ICD-10-CM | POA: Diagnosis present

## 2015-04-16 DIAGNOSIS — I70201 Unspecified atherosclerosis of native arteries of extremities, right leg: Secondary | ICD-10-CM | POA: Diagnosis present

## 2015-04-16 DIAGNOSIS — E780 Pure hypercholesterolemia: Secondary | ICD-10-CM | POA: Diagnosis present

## 2015-04-16 DIAGNOSIS — Z955 Presence of coronary angioplasty implant and graft: Secondary | ICD-10-CM

## 2015-04-16 DIAGNOSIS — T82857A Stenosis of cardiac prosthetic devices, implants and grafts, initial encounter: Secondary | ICD-10-CM | POA: Diagnosis present

## 2015-04-16 DIAGNOSIS — Z7902 Long term (current) use of antithrombotics/antiplatelets: Secondary | ICD-10-CM

## 2015-04-16 DIAGNOSIS — I214 Non-ST elevation (NSTEMI) myocardial infarction: Secondary | ICD-10-CM | POA: Diagnosis present

## 2015-04-16 DIAGNOSIS — R1013 Epigastric pain: Secondary | ICD-10-CM

## 2015-04-16 DIAGNOSIS — Z8673 Personal history of transient ischemic attack (TIA), and cerebral infarction without residual deficits: Secondary | ICD-10-CM

## 2015-04-16 HISTORY — DX: Atherosclerotic heart disease of native coronary artery without angina pectoris: I25.10

## 2015-04-16 LAB — COMPREHENSIVE METABOLIC PANEL
ALT: 28 U/L (ref 0–53)
AST: 105 U/L — ABNORMAL HIGH (ref 0–37)
Albumin: 3.5 g/dL (ref 3.5–5.2)
Alkaline Phosphatase: 108 U/L (ref 39–117)
Anion gap: 8 (ref 5–15)
CHLORIDE: 102 mmol/L (ref 96–112)
CO2: 28 mmol/L (ref 19–32)
CREATININE: 1.23 mg/dL (ref 0.50–1.35)
Calcium: 8.9 mg/dL (ref 8.4–10.5)
GFR calc Af Amer: 73 mL/min — ABNORMAL LOW (ref >90)
GFR, EST NON AFRICAN AMERICAN: 63 mL/min — AB (ref >90)
Glucose, Bld: 131 mg/dL — ABNORMAL HIGH (ref 70–99)
Potassium: 4.5 mmol/L (ref 3.5–5.1)
Sodium: 138 mmol/L (ref 135–145)
Total Bilirubin: 0.8 mg/dL (ref 0.3–1.2)
Total Protein: 7.2 g/dL (ref 6.0–8.3)

## 2015-04-16 LAB — BASIC METABOLIC PANEL
Anion gap: 9 (ref 5–15)
BUN: <5 mg/dL — ABNORMAL LOW (ref 6–23)
CHLORIDE: 106 mmol/L (ref 96–112)
CO2: 25 mmol/L (ref 19–32)
Calcium: 8.8 mg/dL (ref 8.4–10.5)
Creatinine, Ser: 1.23 mg/dL (ref 0.50–1.35)
GFR calc Af Amer: 73 mL/min — ABNORMAL LOW (ref >90)
GFR calc non Af Amer: 63 mL/min — ABNORMAL LOW (ref >90)
GLUCOSE: 101 mg/dL — AB (ref 70–99)
Potassium: 4.3 mmol/L (ref 3.5–5.1)
Sodium: 140 mmol/L (ref 135–145)

## 2015-04-16 LAB — CBC WITH DIFFERENTIAL/PLATELET
Basophils Absolute: 0 10*3/uL (ref 0.0–0.1)
Basophils Relative: 0 % (ref 0–1)
Eosinophils Absolute: 0.3 10*3/uL (ref 0.0–0.7)
Eosinophils Relative: 3 % (ref 0–5)
HCT: 44.8 % (ref 39.0–52.0)
HEMOGLOBIN: 15 g/dL (ref 13.0–17.0)
LYMPHS ABS: 3 10*3/uL (ref 0.7–4.0)
Lymphocytes Relative: 35 % (ref 12–46)
MCH: 28.8 pg (ref 26.0–34.0)
MCHC: 33.5 g/dL (ref 30.0–36.0)
MCV: 86.2 fL (ref 78.0–100.0)
Monocytes Absolute: 0.8 10*3/uL (ref 0.1–1.0)
Monocytes Relative: 10 % (ref 3–12)
NEUTROS ABS: 4.6 10*3/uL (ref 1.7–7.7)
NEUTROS PCT: 52 % (ref 43–77)
Platelets: 306 10*3/uL (ref 150–400)
RBC: 5.2 MIL/uL (ref 4.22–5.81)
RDW: 14.7 % (ref 11.5–15.5)
WBC: 8.7 10*3/uL (ref 4.0–10.5)

## 2015-04-16 LAB — CBC
HCT: 42.2 % (ref 39.0–52.0)
Hemoglobin: 14.3 g/dL (ref 13.0–17.0)
MCH: 29.2 pg (ref 26.0–34.0)
MCHC: 33.9 g/dL (ref 30.0–36.0)
MCV: 86.1 fL (ref 78.0–100.0)
PLATELETS: 297 10*3/uL (ref 150–400)
RBC: 4.9 MIL/uL (ref 4.22–5.81)
RDW: 14.9 % (ref 11.5–15.5)
WBC: 7.9 10*3/uL (ref 4.0–10.5)

## 2015-04-16 LAB — TROPONIN I
TROPONIN I: 11.32 ng/mL — AB (ref <0.031)
TROPONIN I: 31.1 ng/mL — AB (ref <0.031)

## 2015-04-16 LAB — LIPASE, BLOOD: LIPASE: 25 U/L (ref 11–59)

## 2015-04-16 LAB — TSH: TSH: 3.531 u[IU]/mL (ref 0.350–4.500)

## 2015-04-16 LAB — MAGNESIUM: MAGNESIUM: 1.9 mg/dL (ref 1.5–2.5)

## 2015-04-16 LAB — MRSA PCR SCREENING: MRSA by PCR: NEGATIVE

## 2015-04-16 LAB — PROTIME-INR
INR: 1.03 (ref 0.00–1.49)
Prothrombin Time: 13.6 seconds (ref 11.6–15.2)

## 2015-04-16 LAB — I-STAT TROPONIN, ED: Troponin i, poc: 0.9 ng/mL (ref 0.00–0.08)

## 2015-04-16 MED ORDER — ASPIRIN 300 MG RE SUPP: 300.0000 mg | RECTAL | Status: AC

## 2015-04-16 MED ORDER — TICAGRELOR 90 MG PO TABS
90.0000 mg | ORAL_TABLET | Freq: Two times a day (BID) | ORAL | Status: AC
  Administered 2015-04-17: 90 mg via ORAL
  Filled 2015-04-16: qty 1

## 2015-04-16 MED ORDER — SODIUM CHLORIDE 0.9 % IV SOLN: INTRAVENOUS | Status: DC

## 2015-04-16 MED ORDER — HEPARIN BOLUS VIA INFUSION
4000.0000 [IU] | Freq: Once | INTRAVENOUS | Status: AC
  Administered 2015-04-16: 4000 [IU] via INTRAVENOUS
  Filled 2015-04-16: qty 4000

## 2015-04-16 MED ORDER — ACETAMINOPHEN 325 MG PO TABS: 650.0000 mg | ORAL_TABLET | ORAL | Status: DC | PRN

## 2015-04-16 MED ORDER — SODIUM CHLORIDE 0.9 % IV SOLN: 250.0000 mL | INTRAVENOUS | Status: DC | PRN

## 2015-04-16 MED ORDER — TICAGRELOR 90 MG PO TABS
180.0000 mg | ORAL_TABLET | Freq: Once | ORAL | Status: AC
  Administered 2015-04-16: 180 mg via ORAL
  Filled 2015-04-16: qty 2

## 2015-04-16 MED ORDER — ASPIRIN EC 81 MG PO TBEC
81.0000 mg | DELAYED_RELEASE_TABLET | Freq: Every day | ORAL | Status: DC
  Administered 2015-04-17 – 2015-04-18 (×2): 81 mg via ORAL
  Filled 2015-04-16 (×2): qty 1

## 2015-04-16 MED ORDER — PNEUMOCOCCAL VAC POLYVALENT 25 MCG/0.5ML IJ INJ
0.5000 mL | INJECTION | INTRAMUSCULAR | Status: AC
  Administered 2015-04-18: 0.5 mL via INTRAMUSCULAR
  Filled 2015-04-16 (×2): qty 0.5

## 2015-04-16 MED ORDER — NITROGLYCERIN 0.4 MG SL SUBL: 0.4000 mg | SUBLINGUAL_TABLET | SUBLINGUAL | Status: DC | PRN

## 2015-04-16 MED ORDER — MORPHINE SULFATE 4 MG/ML IJ SOLN
4.0000 mg | Freq: Once | INTRAMUSCULAR | Status: AC
Start: 2015-04-16 — End: 2015-04-16
  Administered 2015-04-16: 4 mg via INTRAVENOUS
  Filled 2015-04-16: qty 1

## 2015-04-16 MED ORDER — HEPARIN (PORCINE) IN NACL 100-0.45 UNIT/ML-% IJ SOLN
1200.0000 [IU]/h | INTRAMUSCULAR | Status: DC
  Administered 2015-04-16: 1200 [IU]/h via INTRAVENOUS
  Filled 2015-04-16: qty 250

## 2015-04-16 MED ORDER — SODIUM CHLORIDE 0.9 % IJ SOLN: 3.0000 mL | INTRAMUSCULAR | Status: DC | PRN

## 2015-04-16 MED ORDER — ATORVASTATIN CALCIUM 40 MG PO TABS
40.0000 mg | ORAL_TABLET | Freq: Every day | ORAL | Status: DC
  Administered 2015-04-16 – 2015-04-18 (×3): 40 mg via ORAL
  Filled 2015-04-16 (×3): qty 1

## 2015-04-16 MED ORDER — SODIUM CHLORIDE 0.9 % IJ SOLN
3.0000 mL | Freq: Two times a day (BID) | INTRAMUSCULAR | Status: DC
  Administered 2015-04-17: 3 mL via INTRAVENOUS

## 2015-04-16 MED ORDER — ONDANSETRON HCL 4 MG/2ML IJ SOLN: 4.0000 mg | Freq: Four times a day (QID) | INTRAMUSCULAR | Status: DC | PRN

## 2015-04-16 MED ORDER — ASPIRIN 81 MG PO CHEW
81.0000 mg | CHEWABLE_TABLET | ORAL | Status: AC
  Administered 2015-04-17: 81 mg via ORAL
  Filled 2015-04-16: qty 1

## 2015-04-16 MED ORDER — ALUM & MAG HYDROXIDE-SIMETH 200-200-20 MG/5ML PO SUSP
15.0000 mL | Freq: Once | ORAL | Status: AC
Start: 2015-04-16 — End: 2015-04-16
  Administered 2015-04-16: 15 mL via ORAL
  Filled 2015-04-16: qty 30

## 2015-04-16 MED ORDER — NITROGLYCERIN IN D5W 200-5 MCG/ML-% IV SOLN
0.0000 ug/min | Freq: Once | INTRAVENOUS | Status: AC
  Administered 2015-04-16: 5 ug/min via INTRAVENOUS
  Filled 2015-04-16: qty 250

## 2015-04-16 MED ORDER — ASPIRIN EC 81 MG PO TBEC: 81.0000 mg | DELAYED_RELEASE_TABLET | Freq: Every day | ORAL | Status: DC

## 2015-04-16 MED ORDER — CARVEDILOL 3.125 MG PO TABS
6.2500 mg | ORAL_TABLET | Freq: Two times a day (BID) | ORAL | Status: DC
  Administered 2015-04-16 – 2015-04-18 (×4): 6.25 mg via ORAL
  Filled 2015-04-16: qty 2
  Filled 2015-04-16 (×2): qty 1
  Filled 2015-04-16 (×2): qty 2

## 2015-04-16 MED ORDER — NITROGLYCERIN IN D5W 200-5 MCG/ML-% IV SOLN: 10.0000 ug/min | INTRAVENOUS | Status: DC

## 2015-04-16 MED ORDER — ASPIRIN 81 MG PO CHEW
324.0000 mg | CHEWABLE_TABLET | ORAL | Status: AC
  Filled 2015-04-16: qty 4

## 2015-04-16 MED ORDER — ALUM & MAG HYDROXIDE-SIMETH 200-200-20 MG/5ML PO SUSP
30.0000 mL | Freq: Once | ORAL | Status: AC
  Administered 2015-04-16: 30 mL via ORAL
  Filled 2015-04-16: qty 30

## 2015-04-16 NOTE — ED Notes (Signed)
NOTIFIED DR. S. ZACKOWSKI IN PERSON FOR PATIENTS LAB RESULTS OF I-STAT TROPONIN @13 :50 PM ,04/16/2015.

## 2015-04-16 NOTE — ED Provider Notes (Signed)
CSN: [id number]     Arrival date & time 04/16/15  1234 History   First MD Initiated Contact with Patient 04/16/15 1308     Chief Complaint  Patient presents with  . Chest Pain     (Consider location/radiation/quality/duration/timing/severity/associated sxs/prior Treatment) Patient is a 59 y.o. male presenting with chest pain. The history is provided by the patient. No language interpreter was used.  Chest Pain Pain location:  Substernal area Pain quality: pressure   Pain radiates to:  Does not radiate Pain radiates to the back: no   Pain severity:  Mild Onset quality:  Gradual Timing:  Intermittent Progression:  Waxing and waning Chronicity:  New Context: at rest   Relieved by:  Certain positions Worsened by:  Nothing tried Ineffective treatments:  None tried Associated symptoms: abdominal pain, heartburn and nausea   Associated symptoms: no altered mental status, no anxiety, no cough, no diaphoresis, no dizziness, no fatigue, no fever, no headache, no shortness of breath, not vomiting and no weakness   Risk factors: coronary artery disease, diabetes mellitus, high cholesterol, hypertension, male sex and smoking     Past Medical History  Diagnosis Date  . Hypertension   . Stroke 2007    mini stroke, no residule now  . Myocardial infarction 2007   Past Surgical History  Procedure Laterality Date  . Gallbladder surg in 2012    . Incision and drainage perirectal abscess  08/11/2012    Procedure: IRRIGATION AND DEBRIDEMENT PERIRECTAL ABSCESS;  Surgeon: Robyne Askew, MD;  Location: [facility] OR;  Service: General;  Laterality: N/A;  . Cardiac catheterization  2007    stents placed  . Incision and drainage abscess  11/16/2012    Procedure: INCISION AND DRAINAGE ABSCESS;  Surgeon: Robyne Askew, MD;  Location: [facility] OR;  Service: General;  Laterality: N/A;  incision and drainage perineal abscess   Family History  Problem Relation Age of Onset  . Heart disease Brother   . Cancer  Maternal Uncle     breast   History  Substance Use Topics  . Smoking status: Current Some Day Smoker -- 0.50 packs/day for 30 years    Types: Cigarettes  . Smokeless tobacco: Never Used  . Alcohol Use: No     Comment: occasionally    Review of Systems  Constitutional: Negative for fever, diaphoresis and fatigue.  Respiratory: Negative for cough, chest tightness and shortness of breath.   Cardiovascular: Positive for chest pain.  Gastrointestinal: Positive for heartburn, nausea and abdominal pain. Negative for vomiting, diarrhea and constipation.  Neurological: Negative for dizziness, weakness, light-headedness and headaches.  Psychiatric/Behavioral: Negative for confusion.  All other systems reviewed and are negative.     Allergies  Penicillins  Home Medications   Prior to Admission medications   Medication Sig Start Date End Date Taking? Authorizing Provider  aspirin EC 81 MG tablet Take 81 mg by mouth daily.   Yes Historical Provider, MD  atorvastatin (LIPITOR) 40 MG tablet Take 40 mg by mouth daily.   Yes Historical Provider, MD  carvedilol (COREG) 3.125 MG tablet Take 3.125 mg by mouth 2 (two) times daily with a meal.   Yes Historical Provider, MD  clopidogrel (PLAVIX) 75 MG tablet Take 75 mg by mouth daily.   Yes Historical Provider, MD  diphenhydrAMINE (BENADRYL) 25 MG tablet Take 1 tablet (25 mg total) by mouth every 6 (six) hours as needed for itching (Rash). 06/11/13  Yes Junious Silk, PA-C  fexofenadine (ALLEGRA) 60 MG  tablet Take 1 tablet (60 mg total) by mouth 2 (two) times daily. 09/16/13  Yes Tatyana Kirichenko, PA-C  hydrOXYzine (ATARAX/VISTARIL) 25 MG tablet Take 1 tablet (25 mg total) by mouth every 6 (six) hours. 02/01/14  Yes Roxy Horsemanobert Browning, PA-C  lisinopril (PRINIVIL,ZESTRIL) 10 MG tablet Take 10 mg by mouth daily.   Yes Historical Provider, MD  oxyCODONE-acetaminophen (PERCOCET/ROXICET) 5-325 MG per tablet Take 1 tablet by mouth 2 (two) times daily as  needed. pain 02/17/15  Yes Historical Provider, MD  triamcinolone (KENALOG) 0.025 % ointment Apply 1 application topically 2 (two) times daily. 02/01/14  Yes Roxy Horsemanobert Browning, PA-C   BP 181/86 mmHg  Pulse 63  Temp(Src) 98.2 F (36.8 C)  Resp 18  Ht 5\' 10"  (1.778 m)  Wt 232 lb 1.6 oz (105.28 kg)  BMI 33.30 kg/m2  SpO2 100% Physical Exam  Constitutional: He is oriented to person, place, and time. He appears well-developed and well-nourished. He does not appear ill. No distress.  HENT:  Head: Normocephalic and atraumatic.  Nose: Nose normal.  Mouth/Throat: Oropharynx is clear and moist. No oropharyngeal exudate.  Eyes: EOM are normal. Pupils are equal, round, and reactive to light.  Neck: Normal range of motion. Neck supple. No JVD present.  Cardiovascular: Normal rate, regular rhythm, normal heart sounds and intact distal pulses.   No murmur heard. Pulmonary/Chest: Effort normal and breath sounds normal. No respiratory distress. He has no wheezes. He exhibits tenderness (lower sternal/epigastrium is tenderness to plapation).  Abdominal: Soft. He exhibits no distension. There is tenderness (epigastric). There is no rebound and no guarding.  Musculoskeletal: Normal range of motion. He exhibits no tenderness.  Neurological: He is alert and oriented to person, place, and time. No cranial nerve deficit. Coordination normal.  Skin: Skin is warm and dry. He is not diaphoretic. No pallor.  Psychiatric: He has a normal mood and affect. His behavior is normal. Judgment and thought content normal.  Nursing note and vitals reviewed.   ED Course  Procedures (including critical care time) Labs Review Labs Reviewed  BASIC METABOLIC PANEL - Abnormal; Notable for the following:    Glucose, Bld 101 (*)    BUN <5 (*)    GFR calc non Af Amer 63 (*)    GFR calc Af Amer 73 (*)    All other components within normal limits  I-STAT TROPOININ, ED - Abnormal; Notable for the following:    Troponin i, poc  0.90 (*)    All other components within normal limits  CBC   Imaging Review No results found.   EKG Interpretation   Date/Time:  Wednesday April 16 2015 12:38:19 EDT Ventricular Rate:  73 PR Interval:  134 QRS Duration: 92 QT Interval:  427 QTC Calculation: 470 R Axis:   57 Text Interpretation:  Sinus rhythm Probable LVH with secondary repol abnrm  Anterior ST elevation, probably due to LVH Artifact Confirmed by Deretha EmoryZACKOWSKI   MD, SCOTT [phone]([id number]) on 04/16/2015 12:50:03 PM      MDM   Final diagnoses:  Chest pain, unspecified chest pain type  NSTEMI (non-ST elevated myocardial infarction)  Epigastric pain   Pt is a 59 yo M with hx of HTN and CAD who presents with chest pain.  Reports 3 days of intermittent waxing and waning severity substernal chest pressure.  Reportedly felt like gas pressure.  Relieve with certain movements and burping.  Then developed epigastric pain today.   Has a hx of CAD and states this pain is in the  same place as his previous MI but feels different.  Labs sent including troponin and lipase.  Given ASA 324 and NTG by EMS.  Pain continued, so he also received morphine, maalox, and NTG in the ED prior to resolution of his sx.    EKG with nonspecific ST ischemic changes, different from previous EKG  iStat Trop + at 0.9.  Lab troponin sent to verify.    Spoke to Dr. Sharyn Lull at 1600, will place patient on heparin and nitro drips and admit to cardiology under telemetry.  Pt informed and is agreeable to admission.    Lab troponin returned at 11.    Patient was seen with ED Attending, Dr. Rogers Blocker, MD      Lenell Antu, MD 04/17/15 1610  Blake Divine, MD 04/17/15 Windy Fast

## 2015-04-16 NOTE — ED Notes (Signed)
EKG completed given to EDP.  

## 2015-04-16 NOTE — ED Notes (Signed)
CBG 85

## 2015-04-16 NOTE — H&P (Signed)
Cameron Petty is an 59 y.o. male.   Chief Complaint: Recurrent chest pain/abdominal pain associated with diaphoresis HPI: Patient is 59 year old male with past medical history significant for coronary artery disease history of non-Q-wave myocardial infarction in 2007 requiring PTCA stenting to up use marginal 1, hypertension, hypercholesteremia, degenerative joint disease, came to the ER by EMS complaining of recurrent retrosternal chest pain described as tightness and burning associated with diaphoresis radiating to jaw and neck did not seek any medical attention initially as pain got worst today so decided to call EMS and came to ED. EKG done in the ER showed normal sinus rhythm with LVH and nonspecific ST-T wave changes patient was noted to have minimally elevated troponin I.    Past Medical History  Diagnosis Date  . Hypertension   . Stroke 2007    mini stroke, no residule now  . Myocardial infarction 2007    Past Surgical History  Procedure Laterality Date  . Gallbladder surg in 2012    . Incision and drainage perirectal abscess  08/11/2012    Procedure: IRRIGATION AND DEBRIDEMENT PERIRECTAL ABSCESS;  Surgeon: Merrie Roof, MD;  Location: [facility];  Service: General;  Laterality: N/A;  . Cardiac catheterization  2007    stents placed  . Incision and drainage abscess  11/16/2012    Procedure: INCISION AND DRAINAGE ABSCESS;  Surgeon: Merrie Roof, MD;  Location: [facility];  Service: General;  Laterality: N/A;  incision and drainage perineal abscess    Family History  Problem Relation Age of Onset  . Heart disease Brother   . Cancer Maternal Uncle     breast   Social History:  reports that he has been smoking Cigarettes.  He has a 15 pack-year smoking history. He has never used smokeless tobacco. He reports that he does not drink alcohol or use illicit drugs.  Allergies:  Allergies  Allergen Reactions  . Penicillins Rash    Medications Prior to Admission  Medication Sig  Dispense Refill  . aspirin EC 81 MG tablet Take 81 mg by mouth daily.    Marland Kitchen atorvastatin (LIPITOR) 40 MG tablet Take 40 mg by mouth daily.    . carvedilol (COREG) 3.125 MG tablet Take 3.125 mg by mouth 2 (two) times daily with a meal.    . clopidogrel (PLAVIX) 75 MG tablet Take 75 mg by mouth daily.    . diphenhydrAMINE (BENADRYL) 25 MG tablet Take 1 tablet (25 mg total) by mouth every 6 (six) hours as needed for itching (Rash). 30 tablet 0  . fexofenadine (ALLEGRA) 60 MG tablet Take 1 tablet (60 mg total) by mouth 2 (two) times daily. 30 tablet 2  . hydrOXYzine (ATARAX/VISTARIL) 25 MG tablet Take 1 tablet (25 mg total) by mouth every 6 (six) hours. 20 tablet 0  . lisinopril (PRINIVIL,ZESTRIL) 10 MG tablet Take 10 mg by mouth daily.    Marland Kitchen oxyCODONE-acetaminophen (PERCOCET/ROXICET) 5-325 MG per tablet Take 1 tablet by mouth 2 (two) times daily as needed. pain  0  . triamcinolone (KENALOG) 0.025 % ointment Apply 1 application topically 2 (two) times daily. 30 g 0    Results for orders placed or performed during the hospital encounter of 04/16/15 (from the past 48 hour(s))  CBC     Status: None   Collection Time: 04/16/15  1:08 PM  Result Value Ref Range   WBC 7.9 4.0 - 10.5 K/uL   RBC 4.90 4.22 - 5.81 MIL/uL   Hemoglobin 14.3 13.0 -  17.0 g/dL   HCT 42.2 39.0 - 52.0 %   MCV 86.1 78.0 - 100.0 fL   MCH 29.2 26.0 - 34.0 pg   MCHC 33.9 30.0 - 36.0 g/dL   RDW 14.9 11.5 - 15.5 %   Platelets 297 150 - 400 K/uL  Basic metabolic panel     Status: Abnormal   Collection Time: 04/16/15  1:08 PM  Result Value Ref Range   Sodium 140 135 - 145 mmol/L   Potassium 4.3 3.5 - 5.1 mmol/L   Chloride 106 96 - 112 mmol/L   CO2 25 19 - 32 mmol/L   Glucose, Bld 101 (H) 70 - 99 mg/dL   BUN <5 (L) 6 - 23 mg/dL   Creatinine, Ser 1.23 0.50 - 1.35 mg/dL   Calcium 8.8 8.4 - 10.5 mg/dL   GFR calc non Af Amer 63 (L) >90 mL/min   GFR calc Af Amer 73 (L) >90 mL/min    Comment: (NOTE) The eGFR has been calculated  using the CKD EPI equation. This calculation has not been validated in all clinical situations. eGFR's persistently <90 mL/min signify possible Chronic Kidney Disease.    Anion gap 9 5 - 15  I-stat troponin, ED (not at [facility])     Status: Abnormal   Collection Time: 04/16/15  1:27 PM  Result Value Ref Range   Troponin i, poc 0.90 (HH) 0.00 - 0.08 ng/mL   Comment NOTIFIED PHYSICIAN    Comment 3            Comment: Due to the release kinetics of cTnI, a negative result within the first hours of the onset of symptoms does not rule out myocardial infarction with certainty. If myocardial infarction is still suspected, repeat the test at appropriate intervals.   Troponin I     Status: Abnormal   Collection Time: 04/16/15  3:52 PM  Result Value Ref Range   Troponin I 11.32 (HH) <0.031 ng/mL    Comment:        POSSIBLE MYOCARDIAL ISCHEMIA. SERIAL TESTING RECOMMENDED. REPEATED TO VERIFY CRITICAL RESULT CALLED TO, READ BACK BY AND VERIFIED WITH: C JOHNSON,RN 1705 04/16/15 D BRADLEY   Lipase, blood     Status: None   Collection Time: 04/16/15  3:52 PM  Result Value Ref Range   Lipase 25 11 - 59 U/L   No results found.  Review of Systems  Constitutional: Positive for diaphoresis. Negative for fever, chills and weight loss.  HENT: Negative for hearing loss.   Eyes: Negative for double vision and photophobia.  Respiratory: Negative for cough, hemoptysis and sputum production.   Cardiovascular: Positive for chest pain. Negative for palpitations, orthopnea, claudication and leg swelling.  Gastrointestinal: Positive for abdominal pain. Negative for nausea and vomiting.  Genitourinary: Negative for dysuria.  Neurological: Negative for dizziness and headaches.    Blood pressure 172/90, pulse 64, temperature 98.5 F (36.9 C), temperature source Oral, resp. rate 14, height 5' 10"  (1.778 m), weight 103 kg (227 lb 1.2 oz), SpO2 97 %. Physical Exam  Constitutional: He is oriented to person,  place, and time.  HENT:  Head: Normocephalic and atraumatic.  Eyes: Conjunctivae are normal. Pupils are equal, round, and reactive to light.  Neck: Normal range of motion. No JVD present. No tracheal deviation present. No thyromegaly present.  Cardiovascular: Normal rate and regular rhythm.   Murmur (Soft systolic murmur and S4 gallop noted) heard. Respiratory: Effort normal and breath sounds normal. No respiratory distress. He has no wheezes.  He has no rales.  GI: Soft. Bowel sounds are normal.  Musculoskeletal: He exhibits no edema or tenderness.  Neurological: He is alert and oriented to person, place, and time.     Assessment/Plan Acute non-Q-wave myocardial infarction Coronary artery disease history of non-Q-wave MI in the past status post PCI to obtuse marginal in the past Hypertension Hypercholesteremia Degenerative joint disease Plan Discussed with patient at length regarding left cardiac cath possible PTCA stenting its risk and benefits i.e. death MI stroke need for emergency CABG local vascular complications etc. and consents for PCI.  Clent Demark 04/16/2015, 5:58 PM

## 2015-04-16 NOTE — Progress Notes (Signed)
ANTICOAGULATION CONSULT NOTE - Initial Consult  Pharmacy Consult for heparin Indication: chest pain/ACS  Allergies  Allergen Reactions  . Penicillins Rash    Patient Measurements: Height: 5\' 10"  (177.8 cm) Weight: 232 lb 1.6 oz (105.28 kg) IBW/kg (Calculated) : 73 Heparin Dosing Weight: 95.5 kg  Vital Signs: Temp: 98.2 F (36.8 C) (04/20 1241) BP: 181/86 mmHg (04/20 1430) Pulse Rate: 63 (04/20 1430)  Labs:  Recent Labs  04/16/15 1308  HGB 14.3  HCT 42.2  PLT 297  CREATININE 1.23    Estimated Creatinine Clearance: 78.6 mL/min (by C-G formula based on Cr of 1.23).   Medical History: Past Medical History  Diagnosis Date  . Hypertension   . Stroke 2007    mini stroke, no residule now  . Myocardial infarction 2007    Medications:   (Not in a hospital admission)  Assessment: [age] yoM with a history of MI presents with worsening chest pain x2 days. Initial trop elevated at 0.90, EKG with non-specific ST ischemic changes. No anticoagulation PTA. Pharmacy consulted to dose heparin for ACS. H/H and platelets wnl. SCr 1.23 (CrCl ~75-80 ml/min).  Goal of Therapy:  Heparin level 0.3-0.7 units/ml Monitor platelets by anticoagulation protocol: Yes   Plan:  Heparin 4000 unit IV bolus, then 1200 unit/hr 6 hour HL (2300) Daily HL and CBC Monitor for signs of bleeding  Conception ChancyShah, Megan D 04/16/2015,4:14 PM   I agree with the assessment and plan.    Agapito GamesAlison Najiyah Paris, PharmD, BCPS Clinical Pharmacist Pager: [phone] 04/16/2015 5:27 PM

## 2015-04-16 NOTE — ED Notes (Signed)
Pt from home, started having chest pain on Monday- progressively getting worse. intitially felt burning sensation, now it is tightness between shoulder blades and jaw/chest. Pt had MI in 2007. 8/10 pain. EMS gave 3 nitro. Pt took 1 nitro at home. Pain is now 5/10. EMS also gave 324mg  ASA. BP 198/118 pt has not taken BP meds today. EKG SR with HR 80's

## 2015-04-17 ENCOUNTER — Encounter (HOSPITAL_COMMUNITY)
Admission: EM | Disposition: A | Discharge status: Home / Self Care | Payer: Medicaid Other | Source: Home / Self Care | Attending: Cardiology

## 2015-04-17 ENCOUNTER — Other Ambulatory Visit: Payer: Self-pay

## 2015-04-17 HISTORY — PX: LEFT HEART CATHETERIZATION WITH CORONARY ANGIOGRAM: SHX5451

## 2015-04-17 LAB — BASIC METABOLIC PANEL
Anion gap: 8 (ref 5–15)
BUN: 5 mg/dL — AB (ref 6–23)
CO2: 24 mmol/L (ref 19–32)
CREATININE: 1.18 mg/dL (ref 0.50–1.35)
Calcium: 8.7 mg/dL (ref 8.4–10.5)
Chloride: 108 mmol/L (ref 96–112)
GFR calc Af Amer: 76 mL/min — ABNORMAL LOW (ref >90)
GFR, EST NON AFRICAN AMERICAN: 66 mL/min — AB (ref >90)
GLUCOSE: 100 mg/dL — AB (ref 70–99)
POTASSIUM: 3.9 mmol/L (ref 3.5–5.1)
Sodium: 140 mmol/L (ref 135–145)

## 2015-04-17 LAB — POCT ACTIVATED CLOTTING TIME
ACTIVATED CLOTTING TIME: 221 s
Activated Clotting Time: 460 seconds

## 2015-04-17 LAB — CBC
HEMATOCRIT: 40.2 % (ref 39.0–52.0)
Hemoglobin: 13.3 g/dL (ref 13.0–17.0)
MCH: 28.5 pg (ref 26.0–34.0)
MCHC: 33.1 g/dL (ref 30.0–36.0)
MCV: 86.3 fL (ref 78.0–100.0)
Platelets: 305 10*3/uL (ref 150–400)
RBC: 4.66 MIL/uL (ref 4.22–5.81)
RDW: 14.9 % (ref 11.5–15.5)
WBC: 8.9 10*3/uL (ref 4.0–10.5)

## 2015-04-17 LAB — LIPID PANEL
Cholesterol: 152 mg/dL (ref 0–200)
HDL: 30 mg/dL — AB (ref >39)
LDL Cholesterol: 98 mg/dL (ref 0–99)
Total CHOL/HDL Ratio: 5.1 RATIO
Triglycerides: 118 mg/dL (ref <150)
VLDL: 24 mg/dL (ref 0–40)

## 2015-04-17 LAB — HEPARIN LEVEL (UNFRACTIONATED): Heparin Unfractionated: 0.37 IU/mL (ref 0.30–0.70)

## 2015-04-17 LAB — TROPONIN I
TROPONIN I: 16.34 ng/mL — AB (ref <0.031)
TROPONIN I: 28.98 ng/mL — AB (ref <0.031)

## 2015-04-17 SURGERY — LEFT HEART CATHETERIZATION WITH CORONARY ANGIOGRAM
Anesthesia: LOCAL

## 2015-04-17 MED ORDER — MIDAZOLAM HCL 2 MG/2ML IJ SOLN
INTRAMUSCULAR | Status: AC
  Filled 2015-04-17: qty 2

## 2015-04-17 MED ORDER — ONDANSETRON HCL 4 MG/2ML IJ SOLN: 4.0000 mg | Freq: Four times a day (QID) | INTRAMUSCULAR | Status: DC | PRN

## 2015-04-17 MED ORDER — TICAGRELOR 90 MG PO TABS
90.0000 mg | ORAL_TABLET | Freq: Two times a day (BID) | ORAL | Status: DC
  Administered 2015-04-17 – 2015-04-18 (×2): 90 mg via ORAL
  Filled 2015-04-17 (×4): qty 1

## 2015-04-17 MED ORDER — VERAPAMIL HCL 2.5 MG/ML IV SOLN
INTRAVENOUS | Status: AC
  Filled 2015-04-17: qty 2

## 2015-04-17 MED ORDER — FENTANYL CITRATE (PF) 100 MCG/2ML IJ SOLN
INTRAMUSCULAR | Status: AC
  Filled 2015-04-17: qty 2

## 2015-04-17 MED ORDER — NITROGLYCERIN IN D5W 200-5 MCG/ML-% IV SOLN: 5.0000 ug/min | INTRAVENOUS | Status: DC

## 2015-04-17 MED ORDER — SODIUM CHLORIDE 0.9 % IV SOLN: INTRAVENOUS | Status: AC

## 2015-04-17 MED ORDER — HEPARIN SODIUM (PORCINE) 1000 UNIT/ML IJ SOLN
INTRAMUSCULAR | Status: AC
  Filled 2015-04-17: qty 1

## 2015-04-17 MED ORDER — LIDOCAINE HCL (PF) 1 % IJ SOLN
INTRAMUSCULAR | Status: AC
  Filled 2015-04-17: qty 30

## 2015-04-17 MED ORDER — BIVALIRUDIN 250 MG IV SOLR
INTRAVENOUS | Status: AC
  Filled 2015-04-17: qty 250

## 2015-04-17 MED ORDER — ASPIRIN 81 MG PO CHEW: 81.0000 mg | CHEWABLE_TABLET | Freq: Every day | ORAL | Status: DC

## 2015-04-17 MED ORDER — HEPARIN BOLUS VIA INFUSION
3000.0000 [IU] | INTRAVENOUS | Status: AC
  Administered 2015-04-17: 3000 [IU] via INTRAVENOUS
  Filled 2015-04-17: qty 3000

## 2015-04-17 MED ORDER — HEPARIN (PORCINE) IN NACL 2-0.9 UNIT/ML-% IJ SOLN
INTRAMUSCULAR | Status: AC
  Filled 2015-04-17: qty 1000

## 2015-04-17 MED ORDER — NITROGLYCERIN 1 MG/10 ML FOR IR/CATH LAB
INTRA_ARTERIAL | Status: AC
  Filled 2015-04-17: qty 10

## 2015-04-17 MED ORDER — HEPARIN (PORCINE) IN NACL 100-0.45 UNIT/ML-% IJ SOLN
1450.0000 [IU]/h | INTRAMUSCULAR | Status: DC
  Administered 2015-04-17: 1450 [IU]/h via INTRAVENOUS
  Filled 2015-04-17: qty 250

## 2015-04-17 MED ORDER — ACETAMINOPHEN 325 MG PO TABS: 650.0000 mg | ORAL_TABLET | ORAL | Status: DC | PRN

## 2015-04-17 NOTE — Progress Notes (Signed)
Subjective:  Doing well denies any chest pain or shortness of breath. Tolerated PCI to mid and distal left circumflex in-stent restenosis via right radial approach well.  Objective:  Vital Signs in the last 24 hours: Temp:  [98 F (36.7 C)-98.9 F (37.2 C)] 98.8 F (37.1 C) (04/21 1606) Pulse Rate:  [58-88] 77 (04/21 1700) Resp:  [0-25] 14 (04/21 1700) BP: (114-201)/(54-106) 171/74 mmHg (04/21 1700) SpO2:  [95 %-100 %] 98 % (04/21 1700) Weight:  [103.329 kg (227 lb 12.8 oz)] 103.329 kg (227 lb 12.8 oz) (04/21 0430)  Intake/Output from previous day: 04/20 0701 - 04/21 0700 In: -  Out: 700 [Urine:700] Intake/Output from this shift: Total I/O In: -  Out: 1150 [Urine:1150]  Physical Exam: Neck: no adenopathy, no carotid bruit, no JVD and supple, symmetrical, trachea midline Lungs: clear to auscultation bilaterally Heart: regular rate and rhythm, S1, S2 normal and Soft systolic murmur noted Abdomen: soft, non-tender; bowel sounds normal; no masses,  no organomegaly Extremities: extremities normal, atraumatic, no cyanosis or edema and Both groin stable no evidence of hematoma dressing dry. TR band right wrist with no hematoma and good distal perfusion  Lab Results:  Recent Labs  04/16/15 2029 04/17/15 0633  WBC 8.7 8.9  HGB 15.0 13.3  PLT 306 305    Recent Labs  04/16/15 2029 04/17/15 0633  NA 138 140  K 4.5 3.9  CL 102 108  CO2 28 24  GLUCOSE 131* 100*  BUN <5* 5*  CREATININE 1.23 1.18    Recent Labs  04/17/15 0039 04/17/15 0633  TROPONINI 28.98* 16.34*   Hepatic Function Panel  Recent Labs  04/16/15 2029  PROT 7.2  ALBUMIN 3.5  AST 105*  ALT 28  ALKPHOS 108  BILITOT 0.8    Recent Labs  04/17/15 0633  CHOL 152   No results for input(s): PROTIME in the last 72 hours.  Imaging: Imaging results have been reviewed and No results found.  Cardiac Studies:  Assessment/Plan:  Acute non-Q-wave myocardial infarction status post left cath/PTCA  stenting to in-stent restenosis of left circumflex with excellent angiographic results. Coronary artery disease history of non-Q-wave MI in the past status post PCI to obtuse marginal in the past Peripheral vascular disease History of CVA in the past Hypertension Hypercholesteremia Degenerative joint disease Remote tobacco abuse Plan Continue present management Check labs in a.m.  LOS: 1 day    Ovide Dusek N 04/17/2015, 6:37 PM

## 2015-04-17 NOTE — Interval H&P Note (Signed)
Cath Lab Visit (complete for each Cath Lab visit)  Clinical Evaluation Leading to the Procedure:   ACS: Yes.    Non-ACS:    Anginal Classification: CCS IV  Anti-ischemic medical therapy: Maximal Therapy (2 or more classes of medications)  Non-Invasive Test Results: No non-invasive testing performed  Prior CABG: No previous CABG      History and Physical Interval Note:  04/17/2015 10:14 AM  Cameron Petty  has presented today for surgery, with the diagnosis of unstable angina  The various methods of treatment have been discussed with the patient and family. After consideration of risks, benefits and other options for treatment, the patient has consented to  Procedure(s): LEFT HEART CATHETERIZATION WITH CORONARY ANGIOGRAM (N/A) as a surgical intervention .  The patient's history has been reviewed, patient examined, no change in status, stable for surgery.  I have reviewed the patient's chart and labs.  Questions were answered to the patient's satisfaction.     Robynn PaneHARWANI,Kendelle Schweers N

## 2015-04-17 NOTE — CV Procedure (Addendum)
Procedure performed:  Left heart catheterization including hemodynamic monitoring of the left ventricle, LV gram. Selective right and left coronary arteriography. PTCA and stenting of the mid to distal circumflex in-stent restenotic 95% stenosis with a 3.0 x 28 mm Xience DES. Abdominal aortogram with limited bi-femoral arteriogram.  Indication: Patient is a 59 year-old African-American male with history of hypertension,  hyperlipidemia, known coronary artery disease and history of angioplasty to the circumflex coronary artery in the remote past who presents with NSTEMI.  Patient had ongoing chest discomfort, due to this was brought to the cardiac catheterization lab to evaluate coronary anatomy. Patient was evaluated and angiography was set up by Dr. Sharyn Lull and I was asked to complete the procedure due to access issues via femoral approach.  Patient initially  Was accessed with right femoral artery, realize that the right femoral artery is occluded and hence left femoral arterial access with a day and and again there was significant resistance of advancing the J-wire, hence the procedure was abandoned for the femoral approach and right radial arterial access was obtained to complete the procedure.  Abdominal aortogram with limited by femoral arteriogram was performed to evaluate peripheral arterial disease.   Hemodynamic data: Left ventricular pressure was 108/3 with LVEDP of 11 mm mercury. Aortic pressure was 113/72 with a mean of 88 mm mercury. There was no pressure gradient across the aortic valve.   Left ventricle: Performed in the RAO projection revealed LVEF of 45-50%. There was no significant MR. Mild global hypokinesis.  Right coronary artery: The vessel is large, very mild luminal irregularity.  It is Dominant.  Left main coronary artery is large and normal.  Circumflex coronary artery: A large vessel giving origin to a small to moderate sized obtuse marginal 1. The mid circumflex  coronary artery has in-stent restenotic 95% stenosis.  LAD:  LAD gives origin to a small to moderate-sized D1, small D2 with ostial 70-80% and a large diagonal-3. At the site of D3 origin, LAD has a 40% stenosis and diagonal ostium has a 20% stenosis. LAD has mild diffuse luminal irregularities.   Abdominal aortogram with limited bifemoral runoff: Abdominal aorta shows mild diffuse luminal irregularity and mild atherosclerotic changes.  2 renal arteries one on either side, widely patent.  Aorto-iliac bifurcation is patent, the left external iliac artery has a high-grade 90% stenosis.  Left internal iliac artery is occluded. Right external iliac artery is occluded and distally by collaterals.  The iliac vessels are not visualized.  There femoral artery bilaterally is patent with mild disease.  Impression: High-grade significant single-vessel coronary artery disease involving the mid circumflex coronary artery.  Has in-stent restenotic 95% stenosis. Peripheral arterial disease with occluded right iliac artery and high-grade left iliac artery stenosis.  Interventional data: Successful PTCA and stenting of the  mid to distal circumflex in-stent restenotic 95% stenosis with a 3.0 x 28 mm Xience DES.  Patient will need further evaluation of PAD.  Technique of diagnostic cardiac catheterization:  Under sterile precautions using a 6 French right radial  arterial access, a 6 French sheath was introduced into the right radial artery. A 5 [place] Tig 4 catheter was advanced into the ascending aorta selective  right coronary artery and left coronary artery was cannulated and angiography was performed in multiple views. The catheter was pulled back Out of the body over exchange length J-wire. Same Catheter was used to perform LV gram which was performed in RAO projection. Catheter exchanged out of the body over J-Wire. NO  immediate complications noted.  Patient tolerated the procedure well.   Abdominal aortogram with  bifemoral runoff was performed after angioplasty by advancing a 110 cm pigtail catheter into the mid abdominal aorta.  The catheter exchange was done over the J-wire.  Technique of intervention:  Using a 6 [place] XB 3.5 guide catheter the left main  coronary  was selected and cannulated. Using Angiomax for anticoagulation, I utilized a runthrough guidewire and across the circumflex coronary artery without any difficulty. I placed the tip of the wire into the distal  coronary artery. Angiography was performed. Then I utilized a 2.5 x 12 mm Emerge balloon , I performed balloon angioplasty at 8 pressure x 2 for 30 seconds each.   I proceeded with implantation of a 3.0x28 mm Xience  drug-eluting stent into the mid and distal circumflex coronary artery. The stent was deployed at 11 atmospheric pressure for 30  Seconds, followed by second inflation 13 atmospheric Rusher for 30 seconds.  Intracoronary nitroglycerin 150 g was given and angiography repeated.  Excellent brisk TIMI-3 flow was evident without any edge dissection.  The guidewire was withdrawn, guide catheter disengagement old out of the body.  Abdominal aortogram was performed passing a pigtail catheter into the abdominal aorta.  The catheter was then pulled out of the body was a J-wire.There was no immediate complication. Hemostasis achieved with TR band. Patient tolerated the procedure well.

## 2015-04-17 NOTE — Progress Notes (Signed)
Site area: right groin  Site Prior to Removal:  Level 0  Pressure Applied For 20 MINUTES    Minutes Beginning at 1450  Manual:   Yes.    Patient Status During Pull:  stable  Post Pull Groin Site:  Level 0  Post Pull Instructions Given:  Yes.    Post Pull Pulses Present:  Yes.    Dressing Applied:  Yes.    Comments:  Pt tol sheath pull well. Verbalized understanding of post sheath removal instructions.

## 2015-04-17 NOTE — Progress Notes (Signed)
ANTICOAGULATION CONSULT NOTE - Follow up  Pharmacy Consult for heparin Indication: chest pain/ACS  Allergies  Allergen Reactions  . Penicillins Rash    Patient Measurements: Height:  (177.8 cm) Weight: 227 lb 1.2 oz (103 kg) IBW/kg (Calculated) : 73 Heparin Dosing Weight: 95.5 kg  Vital Signs: Temp: 98.9 F (37.2 C) (04/21 0013) Temp Source: Oral (04/21 0013) BP: 146/82 mmHg (04/21 0013) Pulse Rate: 88 (04/21 0013)  Labs:  Recent Labs  04/16/15 1308 04/16/15 1552 04/16/15 2029 04/16/15 2300  HGB 14.3  --  15.0  --   HCT 42.2  --  44.8  --   PLT 297  --  306  --   LABPROT  --   --  13.6  --   INR  --   --  1.03  --   HEPARINUNFRC  --   --   --  <0.10*  CREATININE 1.23  --  1.23  --   TROPONINI  --  11.32* 31.10*  --     Estimated Creatinine Clearance: 77.7 mL/min (by C-G formula based on Cr of 1.23).   Medical History: Past Medical History  Diagnosis Date  . Hypertension   . Stroke 2007    mini stroke, no residule now  . Myocardial infarction 2007  . Coronary artery disease     Medications:  Prescriptions prior to admission  Medication Sig Dispense Refill Last Dose  . aspirin EC 81 MG tablet Take 81 mg by mouth daily.   Past Week at Unknown time  . atorvastatin (LIPITOR) 40 MG tablet Take 40 mg by mouth daily.   04/15/2015 at Unknown time  . carvedilol (COREG) 3.125 MG tablet Take 3.125 mg by mouth 2 (two) times daily with a meal.   04/15/2015 at 1700  . clopidogrel (PLAVIX) 75 MG tablet Take 75 mg by mouth daily.   04/15/2015 at Unknown time  . diphenhydrAMINE (BENADRYL) 25 MG tablet Take 1 tablet (25 mg total) by mouth every 6 (six) hours as needed for itching (Rash). 30 tablet 0 Past Week at Unknown time  . fexofenadine (ALLEGRA) 60 MG tablet Take 1 tablet (60 mg total) by mouth 2 (two) times daily. 30 tablet 2 04/15/2015 at Unknown time  . hydrOXYzine (ATARAX/VISTARIL) 25 MG tablet Take 1 tablet (25 mg total) by mouth every 6 (six) hours. 20 tablet  0 04/15/2015 at Unknown time  . lisinopril (PRINIVIL,ZESTRIL) 10 MG tablet Take 10 mg by mouth daily.   04/15/2015 at Unknown time  . oxyCODONE-acetaminophen (PERCOCET/ROXICET) 5-325 MG per tablet Take 1 tablet by mouth 2 (two) times daily as needed. pain  0   . triamcinolone (KENALOG) 0.025 % ointment Apply 1 application topically 2 (two) times daily. 30 g 0 04/15/2015 at Unknown time    Assessment: Heparin level <0.1, undetectable on heparin 1200 units/hr in this 59 yo male currently on IV heparin infusion for ACS.  RN reports that heparin has been infusing at correct rate without any problems noted. CBC stable. No bleeding noted per RN's report.   He was admitted on 04/16/15 with a history of MI presents with worsening chest pain x2 days. Initial trop elevated at 0.90, EKG with non-specific ST ischemic changes. No anticoagulation PTA.    Goal of Therapy:  Heparin level 0.3-0.7 units/ml Monitor platelets by anticoagulation protocol: Yes   Plan:  Heparin 3000 unit IV bolus, then increase heparin drip to 1450 unit/hr 6 hour HL  Daily HL and CBC Monitor for signs of bleeding  Noah Delaineuth Betzalel Umbarger, RPh Pager: [phone] 04/17/2015,1:03 AM

## 2015-04-17 NOTE — Progress Notes (Signed)
Received a critical lab value Troponin 31.98. Dr Sharyn LullHarwani notified. Pt is stable and will continue to monitor.

## 2015-04-18 ENCOUNTER — Encounter (HOSPITAL_COMMUNITY): Payer: Self-pay | Admitting: Cardiology

## 2015-04-18 LAB — CBC
HCT: 37.4 % — ABNORMAL LOW (ref 39.0–52.0)
Hemoglobin: 12.4 g/dL — ABNORMAL LOW (ref 13.0–17.0)
MCH: 28.6 pg (ref 26.0–34.0)
MCHC: 33.2 g/dL (ref 30.0–36.0)
MCV: 86.4 fL (ref 78.0–100.0)
PLATELETS: 264 10*3/uL (ref 150–400)
RBC: 4.33 MIL/uL (ref 4.22–5.81)
RDW: 15 % (ref 11.5–15.5)
WBC: 8.1 10*3/uL (ref 4.0–10.5)

## 2015-04-18 LAB — BASIC METABOLIC PANEL
ANION GAP: 9 (ref 5–15)
BUN: 9 mg/dL (ref 6–23)
CHLORIDE: 110 mmol/L (ref 96–112)
CO2: 21 mmol/L (ref 19–32)
Calcium: 8.2 mg/dL — ABNORMAL LOW (ref 8.4–10.5)
Creatinine, Ser: 1.19 mg/dL (ref 0.50–1.35)
GFR calc Af Amer: 76 mL/min — ABNORMAL LOW (ref >90)
GFR, EST NON AFRICAN AMERICAN: 65 mL/min — AB (ref >90)
Glucose, Bld: 98 mg/dL (ref 70–99)
POTASSIUM: 4.3 mmol/L (ref 3.5–5.1)
Sodium: 140 mmol/L (ref 135–145)

## 2015-04-18 LAB — HEPARIN LEVEL (UNFRACTIONATED): Heparin Unfractionated: <0.1 IU/mL — ABNORMAL LOW (ref 0.30–0.70)

## 2015-04-18 LAB — TROPONIN I: TROPONIN I: 7.4 ng/mL — AB (ref <0.031)

## 2015-04-18 MED ORDER — TICAGRELOR 90 MG PO TABS: 90.0000 mg | ORAL_TABLET | Freq: Two times a day (BID) | ORAL | Status: AC

## 2015-04-18 MED ORDER — CARVEDILOL 6.25 MG PO TABS: 6.2500 mg | ORAL_TABLET | Freq: Two times a day (BID) | ORAL | Status: AC

## 2015-04-18 MED ORDER — NITROGLYCERIN 0.4 MG SL SUBL: 0.4000 mg | SUBLINGUAL_TABLET | SUBLINGUAL | Status: AC | PRN

## 2015-04-18 MED FILL — Sodium Chloride IV Soln 0.9%: INTRAVENOUS | Qty: 50 | Status: AC

## 2015-04-18 NOTE — Discharge Summary (Signed)
Discharge summary dictated on 04/18/2015 dictation number is [phone]

## 2015-04-18 NOTE — Discharge Instructions (Signed)
° °  ° °Acute Coronary Syndrome °Acute coronary syndrome (ACS) is an urgent problem in which the blood and oxygen supply to the heart is critically deficient. ACS requires hospitalization because one or more coronary arteries may be blocked. °ACS represents a range of conditions including: °· Previous angina that is now unstable, lasts longer, happens at rest, or is more intense. °· A heart attack, with heart muscle cell injury and death. °There are three vital coronary arteries that supply the heart muscle with blood and oxygen so that it can pump blood effectively. If blockages to these arteries develop, blood flow to the heart muscle is reduced. If the heart does not get enough blood, angina may occur as the first warning sign. °SYMPTOMS  °· The most common signs of angina include: °· Tightness or squeezing in the chest. °· Feeling of heaviness on the chest. °· Discomfort in the arms, neck, back, or jaw. °· Shortness of breath and nausea. °· Cold, wet skin. °· Angina is usually brought on by physical effort or excitement which increase the oxygen needs of the heart. These states increase the blood flow needs of the heart beyond what can be delivered. °· Other symptoms that are not as common include: °· Fatigue °· Unexplained feelings of nervousness or anxiety °· Weakness °· Diarrhea °· Sometimes, you may not have noticed any symptoms at all but still suffered a cardiac injury. °TREATMENT  °· Medicines to help discomfort may include nitroglycerin (nitro) in the form of tablets or a spray for rapid relief, or longer-acting forms such as cream, patches, or capsules. (Be aware that there are many side effects and possible interactions with other drugs). °· Other medicines may be used to help the heart pump better. °· Procedures to open blocked arteries including angioplasty or stent placement to keep the arteries open. °· Open heart surgery may be needed when there are many blockages or they are in critical  locations that are best treated with surgery. °HOME CARE INSTRUCTIONS  °· Do not use any tobacco products including cigarettes, chewing tobacco, or electronic cigarettes. °· Take one baby or adult aspirin daily, if your health care provider advises. This helps reduce the risk of a heart attack. °· It is very important that you follow the angina treatment prescribed by your health care provider. Make arrangements for proper follow-up care. °· Eat a heart healthy diet with salt and fat restrictions as advised. °· Regular exercise is good for you as long as it does not cause discomfort. Do not begin any new type of exercise until you check with your health care provider. °· If you are overweight, you should lose weight. °· Try to maintain normal blood lipid levels. °· Keep your blood pressure under control as recommended by your health care provider. °· You should tell your health care provider right away about any increase in the severity or frequency of your chest discomfort or angina attacks. When you have angina, you should stop what you are doing and sit down. This may bring relief in 3 to 5 minutes. If your health care provider has prescribed nitro, take it as directed. °· If your health care provider has given you a follow-up appointment, it is very important to keep that appointment. Not keeping the appointment could result in a chronic or permanent injury, pain, and disability. If there is any problem keeping the appointment, you must call back to this facility for assistance. °SEEK IMMEDIATE MEDICAL CARE IF:  °· You develop   nausea, vomiting, or shortness of breath. °· You feel faint, lightheaded, or pass out. °· Your chest discomfort gets worse. °· You are sweating or experience sudden profound fatigue. °· You do not get relief of your chest pain after 3 doses of nitro. °· Your discomfort lasts longer than 15 minutes. °MAKE SURE YOU:  °· Understand these instructions. °· Will watch your condition. °· Will get  help right away if you are not doing well or get worse. °· Take all medicines as directed by your health care provider. °Document Released: 12/13/2005 Document Revised: 12/18/2013 Document Reviewed: 04/16/2014 °ExitCare® Patient Information ©2015 [employer]. This information is not intended to replace advice given to you by your health care provider. Make sure you discuss any questions you have with your health care provider. °Coronary Angiogram with Stent °Coronary angiography with stent placement is a procedure to widen or open a narrow blood vessel of the heart (coronary artery). When a coronary artery becomes partially blocked, it decreases blood flow to that area. This may lead to chest pain or a heart attack (myocardial infarction). Arteries may become blocked by cholesterol buildup (plaque) in the lining or wall.  °A stent is a small piece of metal that looks like a mesh or a spring. Stent placement may be done right after a coronary angiography in which a blocked artery is found or as a treatment for a heart attack.  °LET YOUR HEALTH CARE PROVIDER KNOW ABOUT: °· Any allergies you have.   °· All medicines you are taking, including vitamins, herbs, eye drops, creams, and over-the-counter medicines.   °· Previous problems you or members of your family have had with the use of anesthetics.   °· Any blood disorders you have.   °· Previous surgeries you have had.   °· Medical conditions you have. °RISKS AND COMPLICATIONS °Generally, coronary angiography with stent is a safe procedure. However, problems can occur and include: °· Damage to the heart or its blood vessels.   °· A return of blockage.   °· Bleeding, infection, or bruising at the insertion site.   °· A collection of blood under the skin (hematoma) at the insertion site. °· Blood clot in another part of the body.   °· Kidney injury.   °· Allergic reaction to the dye or contrast used.   °· Bleeding into the abdomen (retroperitoneal bleeding). °BEFORE  THE PROCEDURE °· Do not eat or drink anything after midnight on the night before the procedure or as directed by your health care provider.  °· Ask your health care provider about changing or stopping your regular medicines. This is especially important if you are taking diabetes medicines or blood thinners. °· Your health care provider will make sure you understand the procedure as well as the risks and potential problems associated with the procedure.   °PROCEDURE °· You may be given a medicine to help you relax before and during the procedure (sedative). This medicine will be given through an IV tube that is put into one of your veins.   °· The area where the catheter will be inserted will be shaved and cleaned. This is usually done in the groin but may be done in the fold of your arm (near your elbow) or in the wrist.    °· A medicine will be given to numb the area where the catheter will be inserted (local anesthetic).   °· The catheter will be inserted into an artery using a guide wire. A type of X-ray (fluoroscopy) will be used to help guide the catheter to the opening of the blocked   artery.   °· A dye will then be injected into the catheter, and X-rays will be taken. The dye will help to show where any narrowing or blockages are located in the heart arteries.   °· A tiny wire will be guided to the blocked spot, and a balloon will be inflated to make the artery wider. The stent will be expanded and will crush the plaque into the wall of the vessel. The stent will hold the area open like a scaffolding and improve the blood flow.   °· Sometimes the artery may be made wider using a laser or other tools to remove plaque.   °· When the blood flow is better, the catheter will be removed. The lining of the artery will grow over the stent, which stays where it was placed.   °AFTER THE PROCEDURE °· If the procedure is done through the leg, you will be kept in bed lying flat for about 6 hours. You will be instructed to  not bend or cross your legs.   °· The insertion site will be checked frequently.   °· The pulse in your feet or wrist will be checked frequently.   °· Additional blood tests, X-rays, and electrocardiography may be done. °Document Released: 06/19/2003 Document Revised: 04/29/2014 Document Reviewed: 06/21/2013 °ExitCare® Patient Information ©2015 [employer]. This information is not intended to replace advice given to you by your health care provider. Make sure you discuss any questions you have with your health care provider. ° °

## 2015-04-18 NOTE — Care Management Note (Unsigned)
    Page 1 of 1   04/18/2015     10:40:57 AM CARE MANAGEMENT NOTE 04/18/2015  Patient:  Cameron Petty,Cameron Petty   Account Number:  [id number]  Date Initiated:  04/18/2015  Documentation initiated by:  Gae GallopOLE,ANGELA  Subjective/Objective Assessment:   PTA from home with family, admitted with CP.     Action/Plan:   Return to home when medically stable.   Anticipated DC Date:  04/18/2015   Anticipated DC Plan:  HOME/SELF CARE      DC Planning Services  CM consult      Choice offered to / List presented to:             Status of service:  Completed, signed off Medicare Important Message given?  NO (If response is "NO", the following Medicare IM given date fields will be blank) Date Medicare IM given:   Medicare IM given by:   Date Additional Medicare IM given:   Additional Medicare IM given by:    Discharge Disposition:  HOME/SELF CARE  Per UR Regulation:    If discussed at Long Length of Stay Meetings, dates discussed:    Comments:  04/18/2015 0925 Gae Gallopngela Cole RN,BSN,CM [phone] CM spoke to pt about Brilinta, 30 day free card given, pamphlet @ bedside. Pt stated uses [facility] on [address], CM called and confirmed medication in stock and made pt aware. No otheer needs identified per CM.

## 2015-04-18 NOTE — Progress Notes (Signed)
TR BAND REMOVAL  LOCATION:    right radial  DEFLATED PER PROTOCOL:    Yes.    TIME BAND OFF / DRESSING APPLIED:    21:30   SITE UPON ARRIVAL:    Level 0  SITE AFTER BAND REMOVAL:    Level 0  REVERSE ALLEN'S TEST:     positive  CIRCULATION SENSATION AND MOVEMENT:    Within Normal Limits   Yes.    COMMENTS:    

## 2015-04-18 NOTE — Progress Notes (Signed)
CARDIAC REHAB PHASE I   PRE:  Rate/Rhythm: 81 SR  BP:  Supine: 145/82  Sitting:   Standing:    SaO2:   MODE:  Ambulation: 500 ft   POST:  Rate/Rhythm: 111 ST  BP:  Supine:   Sitting: 171/96  Standing:    SaO2:  0810-0905 Pt walked 500 ft with steady gait. Tolerated well. No CP. MI education completed with pt who voiced understanding. Stressed importance of brilinta with stent. Case manager to give brilinta info. Discussed smoking cessation and pt plans to quit cold [place]. Quit once before for 3 months. Discussed CRP 2 and pt gave permission to refer to [place] program .Gave smoking cessation handouts.    Luetta Nuttingharlene Jaryiah Mehlman, RN BSN  04/18/2015 9:00 AM

## 2015-04-18 NOTE — Discharge Summary (Signed)
NAMESUHAIL, PELOQUIN NO.:  [id number]  MEDICAL RECORD NO.:  [id number]  LOCATION:  6C02C                        FACILITY:  [facility]  PHYSICIAN:  Harce Volden N. Sharyn Lull, M.D. DATE OF BIRTH:  [birth year]  DATE OF ADMISSION:  04/16/2015 DATE OF DISCHARGE:  04/18/2015                              DISCHARGE SUMMARY   ADMITTING DIAGNOSES: 1. Acute non-Q-wave myocardial infarction. 2. Coronary artery disease, history of non-Q-wave myocardial     infarction in the past, status post percutaneous coronary     intervention to left circumflex system in the past. 3. Hypertension. 4. Hypercholesteremia. 5. Degenerative joint disease.  DISCHARGE DIAGNOSES: 1. Status post acute non-Q-wave myocardial infarction status post     percutaneous transluminal coronary angioplasty stenting to left     circumflex. 2. Coronary artery disease, history of percutaneous coronary     intervention to left circumflex in the past. 3. Hypertension. 4. Hypercholesteremia. 5. Peripheral vascular disease. 6. History of cerebrovascular accident. 7. Degenerative joint disease. 8. Remote tobacco abuse.  DISCHARGE HOME MEDICATIONS: 1. Nitrostat 0.4 mg sublingual use as directed. 2. Brilinta 90 mg 1 tablet twice daily. 3. Aspirin 81 mg 1 tablet daily. 4. Atorvastatin 40 mg daily. 5. Lisinopril 10 mg daily. 6. Carvedilol 6.25 mg twice daily. 7. Kenalog cream apply locally as needed.  DIET:  Low salt, low cholesterol.  ACTIVITY:  Increase activity slowly as tolerated.  The patient will be scheduled for phase 2 cardiac rehab as outpatient.  CONDITION AT DISCHARGE:  Stable.  The patient will be referred for evaluation for peripheral vascular disease, as outpatient condition at discharge is stable.  BRIEF HISTORY AND HOSPITAL COURSE:  Cameron Petty is 59 year old male with past medical history significant for coronary artery disease, history of non-Q-wave myocardial infarction in the past in  2007, requiring PTCA stenting to obtuse marginal branch, hypertension, hypercholesteremia, degenerative joint disease.  He came to the ER by EMS complaining of recurrent retrosternal chest pain described as tightness, burning associated with diaphoresis radiating to the jaw or neck, did not seek any medical attention initially as pain got worse, so decided to call EMS and came to the ED.  EKG in the ED showed normal sinus rhythm with LVH with nonspecific ST-T wave changes.  The patient was noted to have minimally elevated troponin I.  PHYSICAL EXAMINATION:  GENERAL:  He was alert, awake, oriented x3. VITAL SIGNS:  Blood pressure was 172/90, pulse 64.  He was afebrile. HEENT:  Conjunctivae were pink. NECK:  Supple.  No JVD.  No bruit. LUNGS:  Clear to auscultation without rhonchi or rales. CARDIOVASCULAR:  S1, S2 was normal.  There was soft systolic murmur and S4 gallop. ABDOMEN:  Soft.  Bowel sounds were present.  Nontender. EXTREMITIES:  There is no clubbing, cyanosis, or edema.  LABORATORY DATA:  His labs; sodium was 140, potassium 4.3, BUN less than 5, creatinine 1.23.  His troponin I was 0.90, repeat troponin I was 11.32, 31.10, 28.89, 16.34, and today is 7.40 which is trending down. Hemoglobin was 14.3, hematocrit 42.2, white count of 7.9.  BRIEF HOSPITAL COURSE:  The patient was admitted to step-down unit.  The patient ruled in for non-Q-wave myocardial infarction.  The patient subsequently underwent left cardiac cath with selective left and right coronary angiography and PTCA stenting via right radial approach as patient did not have access through the groin as patient was noted to have occluded SFA on the right side and also critical lesion in external iliac on the left side.  The patient tolerated the procedure well. Postprocedure, patient did not have any episodes of chest pain.  His both the groins and radial artery cath site area is well healed and stable with no  evidence of hematoma or bruit.  The patient is ambulating in room and hallway without any problems.  His cardiac enzymes are trending down.  The patient will be scheduled for phase 2 cardiac rehab as outpatient and also will be scheduled for possible PTA to left external iliac as outpatient.  The patient has been also counseled extensively regarding diet, lifestyle changes and compliance with medication and followup.     Eduardo OsierMohan N. Sharyn LullHarwani, M.D.     [facility]  D:  04/18/2015  T:  04/18/2015  Job:  [id number]

## 2015-05-08 ENCOUNTER — Encounter (HOSPITAL_COMMUNITY)
Admission: RE | Admit: 2015-05-08 | Discharge: 2015-05-08 | Disposition: A | Discharge status: Home / Self Care | Payer: Medicaid Other | Source: Ambulatory Visit | Attending: Cardiology | Admitting: Cardiology

## 2015-05-08 NOTE — Progress Notes (Signed)
Cardiac Rehab Medication Review by a Pharmacist  Does the patient  feel that his/her medications are working for him/her?  yes  Has the patient been experiencing any side effects to the medications prescribed?  no  Does the patient measure his/her own blood pressure or blood glucose at home?  yes   Does the patient have any problems obtaining medications due to transportation or finances?   no  Understanding of regimen: good Understanding of indications: good Potential of compliance: good  Pharmacist comments: Mr. Roa is a 59 year old male presentiWilburt Finlayng to cardiac rehab.  He does not report any side effects of his medications.  His daughter helps monitor his blood pressure at home using a manual device.  In addition, he reports no issues obtaining medications and confirmed that ticagrelor has been reasonably affordable.  I suspect he is compliant with his medications and appears to be educated in the purpose of his medications.    Red ChristiansSamson Abbigale Mcelhaney, Pharm. D. Clinical Pharmacy Resident Pager: [phone] Ph: [phone] 05/08/2015 8:14 AM

## 2015-05-12 ENCOUNTER — Encounter (HOSPITAL_COMMUNITY): Payer: Medicaid Other

## 2015-05-14 ENCOUNTER — Encounter (HOSPITAL_COMMUNITY): Payer: Medicaid Other

## 2015-05-16 ENCOUNTER — Encounter (HOSPITAL_COMMUNITY): Payer: Medicaid Other

## 2015-05-19 ENCOUNTER — Encounter (HOSPITAL_COMMUNITY): Payer: Medicaid Other

## 2015-05-21 ENCOUNTER — Encounter (HOSPITAL_COMMUNITY): Admission: RE | Admit: 2015-05-21 | Payer: Medicaid Other | Source: Ambulatory Visit

## 2015-05-21 ENCOUNTER — Encounter (HOSPITAL_COMMUNITY): Payer: Medicaid Other

## 2015-05-23 ENCOUNTER — Encounter (HOSPITAL_COMMUNITY): Payer: Medicaid Other

## 2015-05-27 ENCOUNTER — Telehealth (HOSPITAL_COMMUNITY): Payer: Self-pay | Admitting: Cardiology

## 2015-05-28 ENCOUNTER — Encounter (HOSPITAL_COMMUNITY): Payer: Medicaid Other

## 2015-05-30 ENCOUNTER — Encounter (HOSPITAL_COMMUNITY): Payer: Medicaid Other

## 2015-06-02 ENCOUNTER — Encounter (HOSPITAL_COMMUNITY): Payer: Medicaid Other

## 2015-06-02 ENCOUNTER — Telehealth (HOSPITAL_COMMUNITY): Payer: Self-pay | Admitting: Cardiology

## 2015-06-04 ENCOUNTER — Encounter (HOSPITAL_COMMUNITY): Payer: Medicaid Other

## 2015-06-06 ENCOUNTER — Encounter (HOSPITAL_COMMUNITY): Payer: Medicaid Other

## 2015-06-09 ENCOUNTER — Encounter (HOSPITAL_COMMUNITY): Payer: Medicaid Other

## 2015-06-11 ENCOUNTER — Encounter (HOSPITAL_COMMUNITY): Payer: Medicaid Other

## 2015-06-13 ENCOUNTER — Encounter (HOSPITAL_COMMUNITY): Payer: Medicaid Other

## 2015-06-16 ENCOUNTER — Encounter (HOSPITAL_COMMUNITY): Payer: Medicaid Other

## 2015-06-18 ENCOUNTER — Encounter (HOSPITAL_COMMUNITY): Payer: Medicaid Other

## 2015-06-20 ENCOUNTER — Encounter (HOSPITAL_COMMUNITY): Payer: Medicaid Other

## 2015-06-23 ENCOUNTER — Encounter (HOSPITAL_COMMUNITY): Payer: Medicaid Other

## 2015-06-25 ENCOUNTER — Encounter (HOSPITAL_COMMUNITY): Payer: Medicaid Other

## 2015-06-27 ENCOUNTER — Encounter (HOSPITAL_COMMUNITY): Payer: Medicaid Other

## 2015-07-02 ENCOUNTER — Encounter (HOSPITAL_COMMUNITY): Payer: Medicaid Other

## 2015-07-04 ENCOUNTER — Encounter (HOSPITAL_COMMUNITY): Payer: Medicaid Other

## 2015-07-07 ENCOUNTER — Encounter (HOSPITAL_COMMUNITY): Payer: Medicaid Other

## 2015-07-09 ENCOUNTER — Encounter (HOSPITAL_COMMUNITY): Payer: Medicaid Other

## 2015-07-11 ENCOUNTER — Encounter (HOSPITAL_COMMUNITY): Payer: Medicaid Other

## 2015-07-14 ENCOUNTER — Encounter (HOSPITAL_COMMUNITY): Payer: Medicaid Other

## 2015-07-16 ENCOUNTER — Encounter (HOSPITAL_COMMUNITY): Payer: Medicaid Other

## 2015-07-18 ENCOUNTER — Encounter (HOSPITAL_COMMUNITY): Payer: Medicaid Other

## 2015-07-21 ENCOUNTER — Encounter (HOSPITAL_COMMUNITY): Payer: Medicaid Other

## 2015-07-23 ENCOUNTER — Encounter (HOSPITAL_COMMUNITY): Payer: Medicaid Other

## 2015-07-25 ENCOUNTER — Encounter (HOSPITAL_COMMUNITY): Payer: Medicaid Other

## 2015-07-28 ENCOUNTER — Encounter (HOSPITAL_COMMUNITY): Payer: Medicaid Other

## 2015-07-30 ENCOUNTER — Encounter (HOSPITAL_COMMUNITY): Payer: Medicaid Other

## 2015-08-01 ENCOUNTER — Encounter (HOSPITAL_COMMUNITY): Payer: Medicaid Other

## 2015-09-15 NOTE — H&P (Signed)
OFFICE VISIT NOTES COPIED TO EPIC FOR DOCUMENTATION  Cameron Petty 08-Sep-2015 12:30 PM Location: [facility] Patient #: [id number] DOB: 06-14-1956 Divorced / Language: Cleophus Molt / Race: Black or African American Male   History of Present Illness Laverda Page MD; 09-08-2015 3:23 PM) The patient is a 59 year old male who presents with peripheral vascular disease. Patient is a 59 year old African-American male with history of known coronary artery disease, he underwent coronary angiography for non-ST elevation myocardial infarction on 04/17/2015, at that time he had difficulty in obtaining right femoral arterial access. Further evaluation and abdominal aortogram at that time and revealed occluded right iliac artery and high-grade stenosis of the left iliac artery.  He also has history of hyperlipidemia, history of remote stroke and tobacco use disorder which is quite since April 2016. He is presently chest pain-free. He is being followed by Dr. Terrence Dupont from cardiac standpoint, referred to me for evaluation of peripheral arterial disease and management of PAD. He has severe lifestyle limiting claudication, both his extremities, left hip and also right hip including bilateral calf heart even during minimal activity. This has been lifestyle limiting. There is no bluish discoloration or ulceration in the feet.   Problem List/Past Medical (Bridgette Ebony Hail, [place]; 09-08-15 2:11 PM) Chest pain (R07.9) Acute non Q wave myocardial infarction (I21.4) CAD (coronary artery disease) (I25.10) Essential hypertension, benign (I10) Hypercholesteremia (E78.0) History of nephrolithiasis (T36.468) History of cerebrovascular accident (E32.12) Tobacco use disorder (Z72.0) PAD (peripheral artery disease) (I73.9) Coronary Angiogram 04/17/2015: High-grade significant single-vessel coronary artery disease involving the mid circumflex coronary artery. Has in-stent restenotic 95%  stenosis. Peripheral arterial disease with occluded right iliac artery and high-grade left iliac artery stenosis. Successful PTCA and stenting of the mid to distal circumflex in-stent restenotic 95% stenosis with a 3.0 x 28 mm Xience DES. Patient will need further evaluation of PAD.  Allergies Adonis Brook Beane; 09/08/2015 1:21 PM) Penicillin VK *PENICILLINS* Swelling.  Family History Adonis Brook Beane; 09/08/2015 1:22 PM) Mother Deceased. in her 19's, from CHF. Father Deceased. in his 73's, from Gangrene in his foot. Sister 1 younger Sister 3 2 older, 1 younger  Social History Adonis Brook Beane; 09-08-2015 1:23 PM) Current tobacco use Former smoker. Quit April, 2016 Alcohol Use Occasional alcohol use. Marital status Divorced. Number of Children 1. Living Situation Lives alone.  Past Surgical History Adonis Brook Beane; 2015/09/08 1:23 PM) Cholecystectomy2013  Medication History Adonis Brook Beane; 09-08-2015 1:24 PM) Aspirin EC (81MG Tablet DR, 1 Oral Daily) Active. Atorvastatin Calcium (40MG Tablet, 1 Oral daily) Active. Carvedilol (6.25MG Tablet, 1 Oral two times daily with a meal) Active. Lisinopril (20MG Tablet, 1 Oral daily) Active. Nitrostat (0.4MG Tab Sublingual, 1 Sublingual under togue every 5 minutes x 3 doses as needed for chest pain) Active. Ticagrelor (90MG Tablet, 1 Oral two times daily) Active. Triamcinolone Acetonide (2.482% Ointment, 1 application External two times daily) Active. Medications Reconciled  Diagnostic Studies History Neldon Labella, AGNP-C; Sep 08, 2015 2:41 PM) Coronary Angiogram04/21/2016 High-grade significant single-vessel coronary artery disease involving the mid circumflex coronary artery. Has in-stent restenotic 95% stenosis. Peripheral arterial disease with occluded right iliac artery and high-grade left iliac artery stenosis. Successful PTCA and stenting of the mid to distal circumflex in-stent restenotic 95% stenosis with a 3.0 x 28 mm  Xience DES. Patient will need further evaluation of PAD. 2007- Stents placed Nuclear stress test06/16/2014 CT Scan of Chest01/09/2013 Colonoscopy2011 Removed benign polyps Endoscopy2009 Within Normal Limits. Echocardiogram2016 Coronary Angiogram05/27/2011 LVEF 50%. Mild inferior wall hypokinesis. Left main: widely patent. LAD: 50-70%  stenosis. The ostium of D1 contains 50-70% narrowing. Flow is brisk. Circumflex: BMS in the mid circumflex is widely patent. Distal circumflex 50-70% stenosis before a large distal OM branch. RCA: 50% stenosis. Abdominal aortography: 70% left iliac and 50% right iliac stenosis. No evidence of renal artery stenosis. No abdominal aortic aneurysm.    Review of Systems (Bridgette Ebony Hail AGNP-C; 08/20/2015 2:13 PM) General Not Present- Anorexia, Fatigue and Fever. Respiratory Not Present- Cough, Decreased Exercise Tolerance and Dyspnea. Cardiovascular Present- Claudications (bilateral, left worse). Not Present- Chest Pain, Edema, Orthopnea, Paroxysmal Nocturnal Dyspnea and Shortness of Breath. Gastrointestinal Not Present- Change in Bowel Habits, Constipation and Nausea. Neurological Not Present- Focal Neurological Symptoms. Endocrine Not Present- Appetite Changes, Cold Intolerance and Heat Intolerance. Hematology Not Present- Anemia, Petechiae and Prolonged Bleeding.  Vitals Adonis Brook Beane; 08/20/2015 1:28 PM) 08/20/2015 1:16 PM Weight: 222.25 lb Height: 68in Body Surface Area: 2.14 m Body Mass Index: 33.79 kg/m  Pulse: 87 (Regular)  P.OX: 99% (Room air) BP: 136/78 (Sitting, Left Arm, Standard)       Physical Exam Laverda Page MD; 08/20/2015 3:21 PM) General Mental Status-Alert. General Appearance-Cooperative, Appears stated age, Not in acute distress. Orientation-Oriented X3. Build & Nutrition-Well built and Mildly obese.  Head and Neck Thyroid Gland Characteristics - no palpable nodules, no palpable  enlargement.  Chest and Lung Exam Palpation Tender - No chest wall tenderness. Auscultation Breath sounds - Clear.  Cardiovascular Inspection Jugular vein - Right - No Distention. Auscultation Heart Sounds - S1 WNL, S2 WNL and No gallop present. Murmurs & Other Heart Sounds - Murmur - No murmur.  Abdomen Palpation/Percussion Palpation and Percussion of the abdomen reveal - Non Tender and No hepatosplenomegaly. Auscultation Auscultation of the abdomen reveals - Bowel sounds normal.  Peripheral Vascular Lower Extremity Inspection - Bilateral - Loss of hair and Shiny atrophic skin, No Varicose veins. Palpation - Edema - Left - No edema. Right - No edema. Femoral pulse - Left - 1/2+ and Bruit. Right - 0+ and Bruit. Popliteal pulse - Left - 0+. Right - 0+. Dorsalis pedis pulse - Left - 0+. Right - 1/2+. Posterior tibial pulse - Left - 0+. Right - 0+. Carotid arteries - Left-Soft Bruit. Carotid arteries - Right-Harsh Bruit(with to-and-fro component). Abdomen-No prominent abdominal aortic pulsation, No epigastric bruit.  Neurologic Motor-Grossly intact without any focal deficits.  Musculoskeletal Global Assessment Left Lower Extremity - normal range of motion without pain. Right Lower Extremity - normal range of motion without pain.    Assessment & Plan Laverda Page MD; 08/20/2015 3:25 PM) PAD (peripheral artery disease) (I73.9) Story: Coronary Angiogram 04/17/2015: Peripheral arterial disease with occluded right iliac artery and high-grade left iliac artery stenosis. Current Plans LE arterial dopplers (64403) Bilateral carotid bruits (R09.89) Current Plans Complete duplex scan of both carotid arteries (47425) History of tobacco abuse (Z56.387) Story: Quit 03/2015 Atherosclerosis of native coronary artery of native heart without angina pectoris (I25.10) Story: Coronary Angiogram 04/17/2015: High-grade significant single-vessel coronary artery disease involving the  mid circumflex coronary artery. Has in-stent restenotic 95% stenosis. Peripheral arterial disease with occluded right iliac artery and high-grade left iliac artery stenosis. Successful PTCA and stenting of the mid to distal circumflex in-stent restenotic 95% stenosis with a 3.0 x 28 mm Xience DES. Impression: EKG 08/20/2015: Normal sinus rhythm at the rate of 82 bpm, normal axis, LVH with repolarization abnormality, cannot exclude inferior and lateral ischemia. Current Plans Complete electrocardiogram (93000) Essential hypertension, benign (I10) Hypercholesteremia (E78.0) Current Plans Mechanism of underlying disease  process and action of medications discussed with the patient. I discussed primary/secondary prevention and also dietary counceling was done. Patient has severe lifestyle limiting claudication, hence will proceed with peripheral arteriogram and possible angioplasty. Prior to proceeding with angioplasty, I would recommend obtaining a baseline lower extremity arterial duplex.  Patient has a very prominent right carotid bruit and a soft left carotid bruit. Right carotid bruit has a to-and-fro component, and hence may suggest high-grade stenosis. I'll also set him up for a carotid artery duplex. I will see him back after the peripheral arteriogram and if stable PRN. He'll continue to follow-up with Dr. Terrence Dupont for cardiac issues.  Needs to schedule for peripheral arteriogram and possible angioplasty given symptoms. Patient understands the risks, benefits, alternatives including medical therapy, CT angiography. Patient understands <1-2% risk of death, embolic complications, bleeding, infection, renal failure, urgent surgical revascularization, but not limited to these and wants to proceed.  CC: Charolette Forward, MD  Addendum Sunday Spillers MD; 09/15/2015 3:16 PM) BMP 09/10/2015: BUN 9, serum creatinine 1.18, eGFR 78 mL. Hemoglobin 9.17/hematocrit 29.3, microcytic indicis. RDW 15.9,  mildly elevated. Pro time normal. Labs are stable for proceeding with peripheral angiography.     Signed by Laverda Page, MD (08/20/2015 3:27 PM)

## 2015-09-16 ENCOUNTER — Ambulatory Visit (HOSPITAL_COMMUNITY)
Admission: RE | Admit: 2015-09-16 | Discharge: 2015-09-16 | Disposition: A | Discharge status: Home / Self Care | Payer: Medicaid Other | Source: Ambulatory Visit | Attending: Cardiology | Admitting: Cardiology

## 2015-09-16 ENCOUNTER — Encounter (HOSPITAL_COMMUNITY)
Admission: RE | Disposition: A | Discharge status: Home / Self Care | Payer: Medicaid Other | Source: Ambulatory Visit | Attending: Cardiology

## 2015-09-16 DIAGNOSIS — I739 Peripheral vascular disease, unspecified: Secondary | ICD-10-CM | POA: Diagnosis present

## 2015-09-16 DIAGNOSIS — Z8673 Personal history of transient ischemic attack (TIA), and cerebral infarction without residual deficits: Secondary | ICD-10-CM | POA: Insufficient documentation

## 2015-09-16 DIAGNOSIS — I2510 Atherosclerotic heart disease of native coronary artery without angina pectoris: Secondary | ICD-10-CM | POA: Diagnosis not present

## 2015-09-16 DIAGNOSIS — I70213 Atherosclerosis of native arteries of extremities with intermittent claudication, bilateral legs: Secondary | ICD-10-CM | POA: Diagnosis not present

## 2015-09-16 DIAGNOSIS — I700 Atherosclerosis of aorta: Secondary | ICD-10-CM | POA: Diagnosis not present

## 2015-09-16 DIAGNOSIS — Z87891 Personal history of nicotine dependence: Secondary | ICD-10-CM | POA: Insufficient documentation

## 2015-09-16 DIAGNOSIS — I10 Essential (primary) hypertension: Secondary | ICD-10-CM | POA: Insufficient documentation

## 2015-09-16 DIAGNOSIS — Z6833 Body mass index (BMI) 33.0-33.9, adult: Secondary | ICD-10-CM | POA: Insufficient documentation

## 2015-09-16 DIAGNOSIS — E780 Pure hypercholesterolemia: Secondary | ICD-10-CM | POA: Insufficient documentation

## 2015-09-16 DIAGNOSIS — E669 Obesity, unspecified: Secondary | ICD-10-CM | POA: Insufficient documentation

## 2015-09-16 HISTORY — PX: PERIPHERAL VASCULAR CATHETERIZATION: SHX172C

## 2015-09-16 LAB — POCT ACTIVATED CLOTTING TIME
ACTIVATED CLOTTING TIME: 165 s
Activated Clotting Time: 177 seconds
Activated Clotting Time: 214 seconds
Activated Clotting Time: 183 seconds

## 2015-09-16 SURGERY — LOWER EXTREMITY ANGIOGRAPHY
Anesthesia: LOCAL | Laterality: Left

## 2015-09-16 MED ORDER — HEPARIN SODIUM (PORCINE) 1000 UNIT/ML IJ SOLN
INTRAMUSCULAR | Status: DC | PRN
  Administered 2015-09-16: 3000 [IU] via INTRAVENOUS
  Administered 2015-09-16: 5000 [IU] via INTRAVENOUS
  Administered 2015-09-16 (×2): 3000 [IU] via INTRAVENOUS

## 2015-09-16 MED ORDER — HEPARIN SODIUM (PORCINE) 1000 UNIT/ML IJ SOLN
INTRAMUSCULAR | Status: AC
  Filled 2015-09-16: qty 1

## 2015-09-16 MED ORDER — HYDROMORPHONE HCL 1 MG/ML IJ SOLN
INTRAMUSCULAR | Status: AC
  Filled 2015-09-16: qty 1

## 2015-09-16 MED ORDER — SODIUM CHLORIDE 0.9 % IV SOLN
INTRAVENOUS | Status: AC
  Administered 2015-09-16: 18:00:00 via INTRAVENOUS

## 2015-09-16 MED ORDER — HYDROMORPHONE HCL 1 MG/ML IJ SOLN
INTRAMUSCULAR | Status: DC | PRN
  Administered 2015-09-16: 0.5 mg via INTRAVENOUS

## 2015-09-16 MED ORDER — HEPARIN (PORCINE) IN NACL 2-0.9 UNIT/ML-% IJ SOLN
INTRAMUSCULAR | Status: AC
  Filled 2015-09-16: qty 1000

## 2015-09-16 MED ORDER — VERAPAMIL HCL 2.5 MG/ML IV SOLN
INTRAVENOUS | Status: AC
  Filled 2015-09-16: qty 2

## 2015-09-16 MED ORDER — MIDAZOLAM HCL 2 MG/2ML IJ SOLN
INTRAMUSCULAR | Status: AC
  Filled 2015-09-16: qty 4

## 2015-09-16 MED ORDER — SODIUM CHLORIDE 0.9 % IV SOLN
INTRAVENOUS | Status: DC
Start: 2015-09-16 — End: 2015-09-17
  Administered 2015-09-16 (×2): via INTRAVENOUS

## 2015-09-16 MED ORDER — LIDOCAINE HCL (PF) 1 % IJ SOLN
INTRAMUSCULAR | Status: AC
  Filled 2015-09-16: qty 30

## 2015-09-16 MED ORDER — MIDAZOLAM HCL 2 MG/2ML IJ SOLN
INTRAMUSCULAR | Status: DC | PRN
  Administered 2015-09-16: 2 mg via INTRAVENOUS

## 2015-09-16 SURGICAL SUPPLY — 25 items
BALLOON POWERFLX PRO 8X40X80 (BALLOONS) ×1 IMPLANT
CATH NAVICROSS ANGLED 135CM (MICROCATHETER) ×1 IMPLANT
CATH OMNI FLUSH 5F 65CM (CATHETERS) ×1 IMPLANT
CATH STRAIGHT 5FR 65CM (CATHETERS) ×1 IMPLANT
COVER PRB 48X5XTLSCP FOLD TPE (BAG) IMPLANT
COVER PROBE 5X48 (BAG) ×3
DEVICE CONTINUOUS FLUSH (MISCELLANEOUS) ×1 IMPLANT
DEVICE RAD COMP TR BAND LRG (VASCULAR PRODUCTS) ×1 IMPLANT
GLIDESHEATH SLEND A-KIT 6F 20G (SHEATH) ×1 IMPLANT
GUIDEWIRE ANGLED .035X260CM (WIRE) ×1 IMPLANT
KIT ENCORE 26 ADVANTAGE (KITS) ×1 IMPLANT
KIT MICROINTRODUCER STIFF 5F (SHEATH) ×1 IMPLANT
KIT PV (KITS) ×3 IMPLANT
SHEATH BRITE TIP 7FR 35CM (SHEATH) ×1 IMPLANT
SHEATH PINNACLE 5F 10CM (SHEATH) ×1 IMPLANT
STENT SMART CONTROL 8X40X120 (Permanent Stent) ×1 IMPLANT
STOPCOCK MORSE 400PSI 3WAY (MISCELLANEOUS) ×1 IMPLANT
SYRINGE MEDRAD AVANTA MACH 7 (SYRINGE) ×1 IMPLANT
TAPE RADIOPAQUE TURBO (MISCELLANEOUS) ×2 IMPLANT
TRANSDUCER W/STOPCOCK (MISCELLANEOUS) ×3 IMPLANT
TRAY PV CATH (CUSTOM PROCEDURE TRAY) ×3 IMPLANT
TUBING CIL FLEX 10 FLL-RA (TUBING) ×1 IMPLANT
WIRE HI TORQ WHISPER MS 300CM (WIRE) ×1 IMPLANT
WIRE HITORQ VERSACORE ST 145CM (WIRE) ×1 IMPLANT
WIRE SAFE-T 1.5MM-J .035X260CM (WIRE) ×1 IMPLANT

## 2015-09-16 NOTE — Progress Notes (Signed)
Per Dr Jacinto Halim pt is to ambulate at 2000 and be discharged at 2100.

## 2015-09-16 NOTE — Progress Notes (Signed)
After pt had small ooze from left femoral artery, pressure was held 15 minutes/ level 0, no pain, PT doppler, Dr Jacinto Halim was called and updated, continue to observe and if no bleeding pt to ambulate as scheduled, otherwise Dr Jacinto Halim will be updated and he will admit.

## 2015-09-16 NOTE — Interval H&P Note (Signed)
History and Physical Interval Note:  09/16/2015 11:46 AM  Glenice Bow Slight  has presented today for surgery, with the diagnosis of claudication  The various methods of treatment have been discussed with the patient and family. After consideration of risks, benefits and other options for treatment, the patient has consented to  Procedure(s): Lower Extremity Angiography (N/A) and possible  Angioplasty as a surgical intervention .  The patient's history has been reviewed, patient examined, no change in status, stable for surgery.  I have reviewed the patient's chart and labs.  Questions were answered to the patient's satisfaction.     Yates Decamp

## 2015-09-16 NOTE — Progress Notes (Signed)
Site area: left groin fa sheath Site Prior to Removal:  Level   0 Pressure Applied For:  25 minutes Manual:   yes Patient Status During Pull:  stable Post Pull Site:  Level  0 Post Pull Instructions Given:  yes Post Pull Pulses Present: yes Dressing Applied:  tegaderm Bedrest begins @  1455 Comments:  0

## 2015-09-16 NOTE — Discharge Instructions (Signed)
Angiogram, Care After °Refer to this sheet in the next few weeks. These instructions provide you with information on caring for yourself after your procedure. Your health care provider may also give you more specific instructions. Your treatment has been planned according to current medical practices, but problems sometimes occur. Call your health care provider if you have any problems or questions after your procedure.  °WHAT TO EXPECT AFTER THE PROCEDURE °After your procedure, it is typical to have the following sensations: °· Minor discomfort or tenderness and a small bump at the catheter insertion site. The bump should usually decrease in size and tenderness within 1 to 2 weeks. °· Any bruising will usually fade within 2 to 4 weeks. °HOME CARE INSTRUCTIONS  °· You may need to keep taking blood thinners if they were prescribed for you. Take medicines only as directed by your health care provider. °· Do not apply powder or lotion to the site. °· Do not take baths, swim, or use a hot tub until your health care provider approves. °· You may shower 24 hours after the procedure. Remove the bandage (dressing) and gently wash the site with plain soap and water. Gently pat the site dry. °· Inspect the site at least twice daily. °· Limit your activity for the first 48 hours. Do not bend, squat, or lift anything over 20 lb (9 kg) or as directed by your health care provider. °· Plan to have someone take you home after the procedure. Follow instructions about when you can drive or return to work. °SEEK MEDICAL CARE IF: °· You get light-headed when standing up. °· You have drainage (other than a small amount of blood on the dressing). °· You have chills. °· You have a fever. °· You have redness, warmth, swelling, or pain at the insertion site. °SEEK IMMEDIATE MEDICAL CARE IF:  °· You develop chest pain or shortness of breath, feel faint, or pass out. °· You have bleeding, swelling larger than a walnut, or drainage from the  catheter insertion site. °· You develop pain, discoloration, coldness, or severe bruising in the leg or arm that held the catheter. °· You develop bleeding from any other place, such as the bowels. You may see bright red blood in your urine or stools, or your stools may appear black and tarry. °· You have heavy bleeding from the site. If this happens, hold pressure on the site. °MAKE SURE YOU: °· Understand these instructions. °· Will watch your condition. °· Will get help right away if you are not doing well or get worse. °Document Released: 07/01/2005 Document Revised: 04/29/2014 Document Reviewed: 05/07/2013 °ExitCare® Patient Information ©2015 [employer]. This information is not intended to replace advice given to you by your health care provider. Make sure you discuss any questions you have with your health care provider. ° °Radial Site Care °Refer to this sheet in the next few weeks. These instructions provide you with information on caring for yourself after your procedure. Your caregiver may also give you more specific instructions. Your treatment has been planned according to current medical practices, but problems sometimes occur. Call your caregiver if you have any problems or questions after your procedure. °HOME CARE INSTRUCTIONS °· You may shower the day after the procedure. Remove the bandage (dressing) and gently wash the site with plain soap and water. Gently pat the site dry. °· Do not apply powder or lotion to the site. °· Do not submerge the affected site in water for 3 to 5 days. °·   Inspect the site at least twice daily. °· Do not flex or bend the affected arm for 24 hours. °· No lifting over 5 pounds (2.3 kg) for 5 days after your procedure. °· Do not drive home if you are discharged the same day of the procedure. Have someone else drive you. °· You may drive 24 hours after the procedure unless otherwise instructed by your caregiver. °· Do not operate machinery or power tools for 24  hours. °· A responsible adult should be with you for the first 24 hours after you arrive home. °What to expect: °· Any bruising will usually fade within 1 to 2 weeks. °· Blood that collects in the tissue (hematoma) may be painful to the touch. It should usually decrease in size and tenderness within 1 to 2 weeks. °SEEK IMMEDIATE MEDICAL CARE IF: °· You have unusual pain at the radial site. °· You have redness, warmth, swelling, or pain at the radial site. °· You have drainage (other than a small amount of blood on the dressing). °· You have chills. °· You have a fever or persistent symptoms for more than 72 hours. °· You have a fever and your symptoms suddenly get worse. °· Your arm becomes pale, cool, tingly, or numb. °· You have heavy bleeding from the site. Hold pressure on the site. °Document Released: 01/15/2011 Document Revised: 03/06/2012 Document Reviewed: 01/15/2011 °ExitCare® Patient Information ©2015 [employer]. This information is not intended to replace advice given to you by your health care provider. Make sure you discuss any questions you have with your health care provider. ° °

## 2015-09-17 ENCOUNTER — Encounter (HOSPITAL_COMMUNITY): Payer: Self-pay | Admitting: Cardiology

## 2015-09-17 MED FILL — Lidocaine HCl Local Preservative Free (PF) Inj 1%: INTRAMUSCULAR | Qty: 30 | Status: AC

## 2015-11-09 NOTE — H&P (Signed)
OFFICE VISIT NOTES COPIED TO EPIC FOR DOCUMENTATION  Binyomin Brann 24-Oct-2015 8:24 AM Location: [facility] Patient #: [phone] DOB: [birth year] Divorced / Language: Cleophus Molt / Race: Black or African American Male   History of Present Illness Laverda Page MD; Oct 24, 2015 1:29 PM) Patient words: 3 Week F/U PV Cath.  The patient is a 59 year old male who presents for a follow-up for Peripheral vascular disease. African-American male with history of known coronary artery disease, he underwent coronary angiography for non-ST elevation myocardial infarction on 04/17/2015, at that time he had difficulty in obtaining right femoral arterial access. Further evaluation and abdominal aortogram at that time and revealed occluded right iliac artery and high-grade stenosis of the left iliac artery.  He also has history of hyperlipidemia, history of remote stroke and tobacco use disorder which is quite since April 2016. He is presently chest pain-free. He is being followed by Dr. Terrence Dupont from cardiac standpoint.  Due to symptoms of severe claudication involving bilateral hip and bilateral lower extremities he underwent peripheral arteriogram and very complex angioplasty to occluded left common iliac artery and underwent successful stent implantation. His right external iliac artery and proximal common femoral artery are occluded with extensive collateral reconstitution. He now presents her for follow-up, has noticed significant improvement in symptoms of claudication involving his left leg but has noticed marked lifestyle limiting claudication of his right hip specifically and also mildly through is right entire lower extremity. Overall he is happy with the results of the angioplasty on the left lower extremity as he is able to walk a little bit further and he still has mild cramping in his left calf.   Problem List/Past Medical (April Louretta Shorten; 24-Oct-2015 12:36 PM) Bilateral carotid  bruits (R09.89) PAD (peripheral artery disease) (I73.9) Peripheral arteriogram 09/16/2015: Right external iliac artery occluded with extensive collaterals, reconstitution at the junction of common femoral and profunda femoral artery, left common iliac occlusion S/P 8.0 x 40 mm Smart self-expanding stent, 100% reduced to 0%. Needs left-to-right femorofemoral bypass surgery if symptoms of claudication. Coronary Angiogram 04/17/2015: Peripheral arterial disease with occluded right iliac artery and high-grade left iliac artery stenosis. Atherosclerosis of native coronary artery of native heart without angina pectoris (I25.10) Coronary Angiogram 04/17/2015: High-grade significant single-vessel coronary artery disease involving the mid circumflex coronary artery. Has in-stent restenotic 95% stenosis. Peripheral arterial disease with occluded right iliac artery and high-grade left iliac artery stenosis. Successful PTCA and stenting of the mid to distal circumflex in-stent restenotic 95% stenosis with a 3.0 x 28 mm Xience DES. History of tobacco abuse (Z87.891) Quit 03/2015 Essential hypertension, benign (I10) BMP 09/10/2015: BUN 9, serum creatinine 1.18, eGFR 78 mL. Hemoglobin 917/hematocrit 29.3, microcytic indicis. RDW 15.9, mildly elevated. Later: Foreign 50,000. Pro time normal. Hypercholesteremia (E78.00) Acute non Q wave myocardial infarction (I21.4) History of nephrolithiasis (U13.244) History of cerebrovascular accident (W10.27)  Allergies (April Garrison; 10-24-15 12:36 PM) Penicillin VK *PENICILLINS* Swelling.  Family History (April Louretta Shorten; 2015-10-24 12:44 PM) Mother Deceased. in her 78's, from CHF. Father Deceased. in his 71's, from Gangrene in his foot. Sister 1 younger Brother 3 2-Older; 1-Younger  Social History (April Garrison; October 24, 2015 12:36 PM) Current tobacco use Former smoker. Quit April, 2016 Alcohol Use Occasional alcohol use. Marital status Divorced. Number  of Children 1. Living Situation Lives alone.  Past Surgical History (April Garrison; 10-24-2015 12:36 PM) Cholecystectomy2013  Medication History (April Louretta Shorten; 10/24/15 12:45 PM) Aspirin EC (81MG Tablet DR, 1 Oral Daily) Active. Atorvastatin Calcium (40MG Tablet, 1  Oral daily) Active. Carvedilol (6.25MG Tablet, 1 Oral two times daily with a meal) Active. Lisinopril (20MG Tablet, 1 Oral daily) Active. Nitrostat (0.4MG Tab Sublingual, 1 Sublingual under togue every 5 minutes x 3 doses as needed for chest pain) Active. Ticagrelor (90MG Tablet, 1 Oral two times daily) Active. Triamcinolone Acetonide (0.109% Ointment, 1 application External two times daily) Active. Medications Reconciled  Diagnostic Studies History (April Garrison; Oct 25, 2015 12:37 PM) Lower Extremity Dopplers09/14/2016 Diffuse monophasic waveforms throughout the right leg suggests iliac stenosis. Right distal SFA/proximal popliteal artery is occluded with collateral flow down stream with monophasic waveforms throughout the right leg with occluded AT. onophasic waveform throuthout the left leg suggests proximal iliac stenosis. Mild plaque throughout. This exam reveals severely decreased perfusion of the right lower extremity, with ABI 0.45 and moderate decrease left with ABI 0.60 noted at the post tibial artery level. PV Angiogram09/20/2016 Right external iliac artery occluded with extensive collaterals, reconstitution at the junction of common femoral and profunda femoral artery, left common iliac occlusion S/P 8.0 x 40 mm Smart self-expanding stent, 100% reduced to 0%. Needs left-to-right femorofemoral bypass surgery if symptoms of claudication. Endoscopy2009 Within Normal Limits. Colonoscopy2011 Removed benign polyps Coronary Angiogram05/27/2011 LVEF 50%. Mild inferior wall hypokinesis. Left main: widely patent. LAD: 50-70% stenosis. The ostium of D1 contains 50-70% narrowing. Flow is brisk. Circumflex: BMS in  the mid circumflex is widely patent. Distal circumflex 50-70% stenosis before a large distal OM branch. RCA: 50% stenosis. Abdominal aortography: 70% left iliac and 50% right iliac stenosis. No evidence of renal artery stenosis. No abdominal aortic aneurysm. CT Scan of Chest01/09/2013 Nuclear stress test06/16/2014 Echocardiogram2016 Coronary Angiogram04/21/2016 High-grade significant single-vessel coronary artery disease involving the mid circumflex coronary artery. Has in-stent restenotic 95% stenosis. Peripheral arterial disease with occluded right iliac artery and high-grade left iliac artery stenosis. Successful PTCA and stenting of the mid to distal circumflex in-stent restenotic 95% stenosis with a 3.0 x 28 mm Xience DES. Patient will need further evaluation of PAD. 2007- Stents placed    Review of Systems Laverda Page MD; Oct 25, 2015 1:26 PM) General Not Present- Anorexia, Fatigue and Fever. Respiratory Not Present- Cough, Decreased Exercise Tolerance and Dyspnea. Cardiovascular Present- Claudications (bilateral, right hip and right leg worse). Not Present- Chest Pain, Edema, Orthopnea, Paroxysmal Nocturnal Dyspnea and Shortness of Breath. Gastrointestinal Not Present- Change in Bowel Habits, Constipation and Nausea. Neurological Not Present- Focal Neurological Symptoms. Endocrine Not Present- Appetite Changes, Cold Intolerance and Heat Intolerance. Hematology Not Present- Anemia, Petechiae and Prolonged Bleeding.  Vitals (April Garrison; 10/25/15 12:48 PM) 25-Oct-2015 12:40 PM Weight: 223.25 lb Height: 68in Body Surface Area: 2.14 m Body Mass Index: 33.94 kg/m  Pulse: 84 (Regular)  P.OX: 98% (Room air) BP: 126/52 (Sitting, Left Arm, Standard)       Physical Exam Laverda Page MD; 10-25-15 1:24 PM) General Mental Status-Alert. General Appearance-Cooperative, Appears stated age, Not in acute distress. Orientation-Oriented X3. Build &  Nutrition-Well built and Mildly obese.  Head and Neck Thyroid Gland Characteristics - no palpable nodules, no palpable enlargement.  Chest and Lung Exam Chest and lung exam reveals -quiet, even and easy respiratory effort with no use of accessory muscles and on auscultation, normal breath sounds, no adventitious sounds and normal vocal resonance. Palpation Tender - No chest wall tenderness.  Cardiovascular Cardiovascular examination reveals -normal heart sounds, regular rate and rhythm with no murmurs. Inspection Jugular vein - Right - No Distention. Auscultation  Abdomen Palpation/Percussion Normal exam - Non Tender and No hepatosplenomegaly. Auscultation Normal  exam - Bowel sounds normal.  Peripheral Vascular Lower Extremity Inspection - Bilateral - Loss of hair and Shiny atrophic skin, No Varicose veins. Palpation - Edema - Bilateral - No edema. Femoral pulse - Left - 2+ and Bruit. Right - 0+ and Bruit. Popliteal pulse - Bilateral - 0+. Dorsalis pedis pulse - Bilateral - 0+ and Absent. Posterior tibial pulse - Bilateral - Absent. Carotid arteries - Left-Soft Bruit. Carotid arteries - Right-Harsh Bruit(with to-and-fro component). Abdomen-No prominent abdominal aortic pulsation, No epigastric bruit.  Neurologic Motor-Grossly intact without any focal deficits.  Musculoskeletal Global Assessment Left Lower Extremity - normal range of motion without pain. Right Lower Extremity - normal range of motion without pain.    Assessment & Plan (Devonna Shumate; 10/20/2015 2:09 PM) PAD (peripheral artery disease) (I73.9) Story: Peripheral arteriogram 09/16/2015: Right external iliac artery occluded with extensive collaterals, reconstitution at the junction of common femoral and profunda femoral artery, left common iliac occlusion S/P 8.0 x 40 mm Smart self-expanding stent, 100% reduced to 0%. Needs left-to-right femorofemoral bypass surgery if symptoms of  claudication.  Coronary Angiogram 04/17/2015: Peripheral arterial disease with occluded right iliac artery and high-grade left iliac artery stenosis. Future Plans 10/28/2015: ABI (ankle brachial index) [phone]) - one time 59/08/3569: METABOLIC PANEL, BASIC (17793) - one time 11/03/2015: CBC & PLATELETS (AUTO) (90300) - one time 11/03/2015: PT (PROTHROMBIN TIME) (92330) - one time Bilateral carotid bruits (R09.89) Current Plans Patient underwent peripheral arteriogram on 09/16/2015 and complex left iliac artery stenting. He is occluded right external iliac and common femoral artery. He still has significant lifestyle limiting claudication, his only option is surgery, he may benefit from left to right femoral-femoral bypass surgery. Carotid artery duplex pending for bruit.  He will need reevaluation of complete lower extremity arterial tree as an only performed limited study due to contrast and complex peripheral intervention that I performed for occluded left iliac vessels. Hence I'll set him up for a repeat ABI to establish her baseline since left iliac angioplasty and also will perform peripheral arteriogram, diagnostic study only to evaluate the lower extremity runoff at the level of the distal SFA and below and refer him to be evaluated bypass the surgery. I will see him back in 3 months and if he remains stable I will see him back on a when necessary basis with follow-up with his cardiologist Dr. Terrence Dupont.  Future Plans 10/23/2015: Complete duplex scan of both carotid arteries (07622) - one time Addendum Note(Bridgette Allison AGNP-C; 11/08/2015 4:41 PM) 11/07/2015: Creatinine 1.22, potassium 4.9, HB 8.9/HCT 27.8 with microcytic indices, PLT 531, PT 9.9, INR 0.9     Signed by Laverda Page, MD (10/20/2015 9:57 PM)

## 2015-11-11 ENCOUNTER — Ambulatory Visit (HOSPITAL_COMMUNITY)
Admission: RE | Admit: 2015-11-11 | Discharge: 2015-11-11 | Disposition: A | Discharge status: Home / Self Care | Payer: Medicaid Other | Source: Ambulatory Visit | Attending: Cardiology | Admitting: Cardiology

## 2015-11-11 ENCOUNTER — Encounter (HOSPITAL_COMMUNITY)
Admission: RE | Disposition: A | Discharge status: Home / Self Care | Payer: Self-pay | Source: Ambulatory Visit | Attending: Cardiology

## 2015-11-11 DIAGNOSIS — I6521 Occlusion and stenosis of right carotid artery: Secondary | ICD-10-CM | POA: Diagnosis not present

## 2015-11-11 DIAGNOSIS — I10 Essential (primary) hypertension: Secondary | ICD-10-CM | POA: Insufficient documentation

## 2015-11-11 DIAGNOSIS — E7800 Pure hypercholesterolemia, unspecified: Secondary | ICD-10-CM | POA: Insufficient documentation

## 2015-11-11 DIAGNOSIS — Z7982 Long term (current) use of aspirin: Secondary | ICD-10-CM | POA: Diagnosis not present

## 2015-11-11 DIAGNOSIS — E669 Obesity, unspecified: Secondary | ICD-10-CM | POA: Diagnosis not present

## 2015-11-11 DIAGNOSIS — Z8673 Personal history of transient ischemic attack (TIA), and cerebral infarction without residual deficits: Secondary | ICD-10-CM | POA: Insufficient documentation

## 2015-11-11 DIAGNOSIS — Z87891 Personal history of nicotine dependence: Secondary | ICD-10-CM | POA: Insufficient documentation

## 2015-11-11 DIAGNOSIS — I252 Old myocardial infarction: Secondary | ICD-10-CM | POA: Insufficient documentation

## 2015-11-11 DIAGNOSIS — I2510 Atherosclerotic heart disease of native coronary artery without angina pectoris: Secondary | ICD-10-CM | POA: Insufficient documentation

## 2015-11-11 DIAGNOSIS — Z6833 Body mass index (BMI) 33.0-33.9, adult: Secondary | ICD-10-CM | POA: Insufficient documentation

## 2015-11-11 DIAGNOSIS — I70213 Atherosclerosis of native arteries of extremities with intermittent claudication, bilateral legs: Secondary | ICD-10-CM | POA: Insufficient documentation

## 2015-11-11 DIAGNOSIS — I739 Peripheral vascular disease, unspecified: Secondary | ICD-10-CM | POA: Diagnosis present

## 2015-11-11 HISTORY — PX: PERIPHERAL VASCULAR CATHETERIZATION: SHX172C

## 2015-11-11 LAB — POCT ACTIVATED CLOTTING TIME: ACTIVATED CLOTTING TIME: 128 s

## 2015-11-11 SURGERY — LOWER EXTREMITY ANGIOGRAPHY

## 2015-11-11 MED ORDER — SODIUM CHLORIDE 0.9 % IV SOLN: 1.0000 mL/kg/h | INTRAVENOUS | Status: DC

## 2015-11-11 MED ORDER — HEPARIN (PORCINE) IN NACL 2-0.9 UNIT/ML-% IJ SOLN
INTRAMUSCULAR | Status: AC
  Filled 2015-11-11: qty 1000

## 2015-11-11 MED ORDER — HEPARIN SODIUM (PORCINE) 1000 UNIT/ML IJ SOLN
INTRAMUSCULAR | Status: AC
  Filled 2015-11-11: qty 1

## 2015-11-11 MED ORDER — HEPARIN (PORCINE) IN NACL 2-0.9 UNIT/ML-% IJ SOLN
INTRAMUSCULAR | Status: DC | PRN
  Administered 2015-11-11: 25 mL

## 2015-11-11 MED ORDER — HEPARIN SODIUM (PORCINE) 1000 UNIT/ML IJ SOLN
INTRAMUSCULAR | Status: DC | PRN
  Administered 2015-11-11: 3000 [IU] via INTRAVENOUS

## 2015-11-11 MED ORDER — IODIXANOL 320 MG/ML IV SOLN
INTRAVENOUS | Status: DC | PRN
  Administered 2015-11-11: 205 mL via INTRA_ARTERIAL

## 2015-11-11 MED ORDER — LIDOCAINE HCL (PF) 1 % IJ SOLN
INTRAMUSCULAR | Status: AC
  Filled 2015-11-11: qty 30

## 2015-11-11 MED ORDER — SODIUM CHLORIDE 0.9 % IV SOLN
INTRAVENOUS | Status: DC
  Administered 2015-11-11: 06:00:00 via INTRAVENOUS

## 2015-11-11 SURGICAL SUPPLY — 16 items
CATH ANGIO 5F JB1 100CM (CATHETERS) ×2 IMPLANT
CATH ANGIO 5F PIGTAIL 100CM (CATHETERS) ×2 IMPLANT
CATH SOFT-VU ST 4F 90CM (CATHETERS) ×2 IMPLANT
KIT ENCORE 26 ADVANTAGE (KITS) ×2 IMPLANT
KIT PV (KITS) ×4 IMPLANT
SET INTRODUCER MICROPUNCT 5F (INTRODUCER) ×2 IMPLANT
SHEATH BRITE TIP 7FR 35CM (SHEATH) ×2 IMPLANT
SHEATH PINNACLE 5F 10CM (SHEATH) ×2 IMPLANT
STENT OMNILINK ELITE 10X39X80 (Permanent Stent) ×2 IMPLANT
STOPCOCK MORSE 400PSI 3WAY (MISCELLANEOUS) ×2 IMPLANT
SYRINGE MEDRAD AVANTA MACH 7 (SYRINGE) ×2 IMPLANT
TRANSDUCER W/STOPCOCK (MISCELLANEOUS) ×4 IMPLANT
TRAY PV CATH (CUSTOM PROCEDURE TRAY) ×4 IMPLANT
TUBING CIL FLEX 10 FLL-RA (TUBING) ×2 IMPLANT
WIRE HITORQ VERSACORE ST 145CM (WIRE) ×2 IMPLANT
WIRE VERSACORE LOC 115CM (WIRE) ×2 IMPLANT

## 2015-11-11 NOTE — Interval H&P Note (Signed)
History and Physical Interval Note:  11/11/2015 6:28 AM  Cameron Petty  has presented today for surgery, with the diagnosis of claudication  The various methods of treatment have been discussed with the patient and family. After consideration of risks, benefits and other options for treatment, the patient has consented to  Procedure(s): Lower Extremity Angiography (N/A) Cebral Angiography (N/A) and possible PTA as a surgical intervention .  The patient's history has been reviewed, patient examined, no change in status, stable for surgery.  I have reviewed the patient's chart and labs.  Questions were answered to the patient's satisfaction.     Yates DecampGANJI, Draydon Clairmont

## 2015-11-11 NOTE — Discharge Instructions (Signed)
Angiogram, Care After °Refer to this sheet in the next few weeks. These instructions provide you with information about caring for yourself after your procedure. Your health care provider may also give you more specific instructions. Your treatment has been planned according to current medical practices, but problems sometimes occur. Call your health care provider if you have any problems or questions after your procedure. °WHAT TO EXPECT AFTER THE PROCEDURE °After your procedure, it is typical to have the following: °· Bruising at the catheter insertion site that usually fades within 1-2 weeks. °· Blood collecting in the tissue (hematoma) that may be painful to the touch. It should usually decrease in size and tenderness within 1-2 weeks. °HOME CARE INSTRUCTIONS °· Take medicines only as directed by your health care provider. °· You may shower 24-48 hours after the procedure or as directed by your health care provider. Remove the bandage (dressing) and gently wash the site with plain soap and water. Pat the area dry with a clean towel. Do not rub the site, because this may cause bleeding. °· Do not take baths, swim, or use a hot tub until your health care provider approves. °· Check your insertion site every day for redness, swelling, or drainage. °· Do not apply powder or lotion to the site. °· Do not lift over 10 lb (4.5 kg) for 5 days after your procedure or as directed by your health care provider. °· Ask your health care provider when it is okay to: °¨ Return to work or school. °¨ Resume usual physical activities or sports. °¨ Resume sexual activity. °· Do not drive home if you are discharged the same day as the procedure. Have someone else drive you. °· You may drive 24 hours after the procedure unless otherwise instructed by your health care provider. °· Do not operate machinery or power tools for 24 hours after the procedure or as directed by your health care provider. °· If your procedure was done as an  outpatient procedure, which means that you went home the same day as your procedure, a responsible adult should be with you for the first 24 hours after you arrive home. °· Keep all follow-up visits as directed by your health care provider. This is important. °SEEK MEDICAL CARE IF: °· You have a fever. °· You have chills. °· You have increased bleeding from the catheter insertion site. Hold pressure on the site. °SEEK IMMEDIATE MEDICAL CARE IF: °· You have unusual pain at the catheter insertion site. °· You have redness, warmth, or swelling at the catheter insertion site. °· You have drainage (other than a small amount of blood on the dressing) from the catheter insertion site. °· The catheter insertion site is bleeding, and the bleeding does not stop after 30 minutes of holding steady pressure on the site. °· The area near or just beyond the catheter insertion site becomes pale, cool, tingly, or numb. °  °This information is not intended to replace advice given to you by your health care provider. Make sure you discuss any questions you have with your health care provider. °  °Document Released: 07/01/2005 Document Revised: 01/03/2015 Document Reviewed: 05/16/2013 °Elsevier Interactive Patient Education ©2016 Elsevier Inc. ° °

## 2015-11-11 NOTE — Progress Notes (Signed)
Site area: left groin  Site Prior to Removal:  Level 0  Pressure Applied For 20 MINUTES    Minutes Beginning at 0925  Manual:   Yes.    Patient Status During Pull:  stable  Post Pull Groin Site:  Level 0  Post Pull Instructions Given:  Yes.    Post Pull Pulses Present:  Yes.    Dressing Applied:  Yes.     Bedrest Begins: 0945  Comments:  Bedrest x 4 hours. Pt tolerated left groin sheath pull well. VSS

## 2015-11-12 ENCOUNTER — Encounter (HOSPITAL_COMMUNITY): Payer: Self-pay | Admitting: Cardiology

## 2015-11-26 ENCOUNTER — Encounter: Payer: Self-pay | Admitting: Surgery

## 2015-12-01 ENCOUNTER — Encounter: Payer: Medicaid Other | Admitting: Surgery

## 2015-12-03 ENCOUNTER — Encounter: Payer: Self-pay | Admitting: Vascular Surgery

## 2015-12-09 ENCOUNTER — Ambulatory Visit (INDEPENDENT_AMBULATORY_CARE_PROVIDER_SITE_OTHER): Payer: Medicaid Other | Admitting: Vascular Surgery

## 2015-12-09 ENCOUNTER — Encounter: Payer: Self-pay | Admitting: Vascular Surgery

## 2015-12-09 VITALS — BP 103/65 | HR 80 | Temp 98.0°F | Resp 18 | Ht 70.0 in | Wt 218.0 lb

## 2015-12-09 DIAGNOSIS — I6521 Occlusion and stenosis of right carotid artery: Secondary | ICD-10-CM

## 2015-12-09 DIAGNOSIS — I6529 Occlusion and stenosis of unspecified carotid artery: Secondary | ICD-10-CM | POA: Insufficient documentation

## 2015-12-09 DIAGNOSIS — I739 Peripheral vascular disease, unspecified: Secondary | ICD-10-CM

## 2015-12-09 NOTE — Progress Notes (Signed)
Subjective:     Patient ID: Cameron Petty, male   DOB: [birth year], 59 y.o.   MRN: [id number]  HPI this 59 year old male was referred by Dr. Yates DecampJay Ganji for evaluation of possible right carotid stent and left right femoral-femoral bypass. Patient suffered a right brain stroke in 2007 and also had PTCA and stenting performed at that time. Since then he has been on Brilenta. He was found recently by Dr. Jacinto HalimGanji to have significant bilateral iliac occlusive disease and he performed left iliac stenting successfully. Patient has occlusion of his right iliac system and needs a left right femoral-femoral bypass. He has limiting claudication after walking 20-30 feet he must stop walking. He has no history of gangrene or nonhealing ulcers in the right foot. 2 months ago he suffered a right brain TIA consisting of clumsiness in the left upper extremity and weakness in the left facial area. This resolved and did not result in a CVA. Patient is on aspirin. He was referred for further treatment.  Past Medical History  Diagnosis Date  . Hypertension   . Stroke [facility](HCC) 2007    mini stroke, no residule now  . Myocardial infarction (HCC) 2007  . Coronary artery disease   . Carotid artery occlusion     Social History  Substance Use Topics  . Smoking status: Current Some Day Smoker -- 0.50 packs/day for 30 years    Types: Cigarettes  . Smokeless tobacco: Never Used  . Alcohol Use: No     Comment: occasionally    Family History  Problem Relation Age of Onset  . Heart disease Brother   . Cancer Maternal Uncle     breast    Allergies  Allergen Reactions  . Penicillins Rash     Current outpatient prescriptions:  .  aspirin EC 81 MG tablet, Take 81 mg by mouth daily., Disp: , Rfl:  .  atorvastatin (LIPITOR) 40 MG tablet, Take 40 mg by mouth daily., Disp: , Rfl:  .  carvedilol (COREG) 6.25 MG tablet, Take 1 tablet (6.25 mg total) by mouth 2 (two) times daily with a meal., Disp: 60 tablet, Rfl: 3 .   lisinopril (PRINIVIL,ZESTRIL) 20 MG tablet, Take 20 mg by mouth daily., Disp: , Rfl:  .  nitroGLYCERIN (NITROSTAT) 0.4 MG SL tablet, Place 1 tablet (0.4 mg total) under the tongue every 5 (five) minutes x 3 doses as needed for chest pain., Disp: 25 tablet, Rfl: 12 .  ticagrelor (BRILINTA) 90 MG TABS tablet, Take 1 tablet (90 mg total) by mouth 2 (two) times daily., Disp: 60 tablet, Rfl: 11 .  triamcinolone (KENALOG) 0.025 % ointment, Apply 1 application topically 2 (two) times daily., Disp: 30 g, Rfl: 0  Filed Vitals:   12/09/15 1020 12/09/15 1025  BP: 111/65 103/65  Pulse: 79 80  Temp: 98 F (36.7 C)   Resp: 18   Height: 5\' 10"  (1.778 m)   Weight: 218 lb (98.884 kg)   SpO2: 99%     Body mass index is 31.28 kg/(m^2).           Review of Systems denies chest pain, dyspnea on exertion, PND, orthopnea, hemoptysis-see history of present illness. Other systems negative and complete review of systems    Objective:   Physical Exam BP 103/65 mmHg  Pulse 80  Temp(Src) 98 F (36.7 C)  Resp 18  Ht 5\' 10"  (1.778 m)  Wt 218 lb (98.884 kg)  BMI 31.28 kg/m2  SpO2 99%  Gen.-alert and oriented x3  in no apparent distress HEENT normal for age Lungs no rhonchi or wheezing Cardiovascular regular rhythm no murmurs carotid pulses 3+ palpable  partial bruit right carotid artery and supraclavicular area Abdomen soft nontender no palpable masses Musculoskeletal free of  major deformities Skin clear -no rashes Neurologic normal Lower extremities 3+ femoral and posterior tibial pulse palpable left leg Absent right femoral and distal pulses.  Today I reviewed the angiograms performed by Dr.Ganji including cerebral angiogram and aortobifemoral with runoff. I agree patient does have an 80% tapered stenosis of the proximal right common carotid artery just above the clavicle. He also has total occlusion of the right iliac system with good runoff distally.       Assessment:     #1 right  common carotid stenosis with recent right brain TIA #2 total occlusion right iliac system with previous stenting left iliac system causing severe claudication and right leg limiting patient to walking about 10-20 yards Remote history of coronary artery disease with PTCA and stenting in 2007 Remote history of right brain CVA in 2007 Hypertension    Plan:     Discussed situation with Dr. Myra Gianotti who will review cerebral angiomas further and make decision regarding possible right common carotid stent We will then proceed with left right femoral-femoral bypass grafting to relieve his right leg symptoms Discuss this with patient and he is agreeable and we will be in touch with him regarding the plans

## 2015-12-24 ENCOUNTER — Other Ambulatory Visit: Payer: Self-pay

## 2016-01-20 ENCOUNTER — Ambulatory Visit (HOSPITAL_COMMUNITY)
Admission: RE | Admit: 2016-01-20 | Discharge: 2016-01-21 | Disposition: A | Discharge status: Home / Self Care | Payer: Medicaid Other | Source: Ambulatory Visit | Attending: Surgery | Admitting: Surgery

## 2016-01-20 ENCOUNTER — Encounter (HOSPITAL_COMMUNITY): Payer: Self-pay | Admitting: *Deleted

## 2016-01-20 ENCOUNTER — Encounter (HOSPITAL_COMMUNITY)
Admission: RE | Disposition: A | Discharge status: Home / Self Care | Payer: Self-pay | Source: Ambulatory Visit | Attending: Surgery

## 2016-01-20 DIAGNOSIS — I2510 Atherosclerotic heart disease of native coronary artery without angina pectoris: Secondary | ICD-10-CM | POA: Insufficient documentation

## 2016-01-20 DIAGNOSIS — Z23 Encounter for immunization: Secondary | ICD-10-CM | POA: Insufficient documentation

## 2016-01-20 DIAGNOSIS — I6521 Occlusion and stenosis of right carotid artery: Secondary | ICD-10-CM | POA: Diagnosis not present

## 2016-01-20 DIAGNOSIS — I252 Old myocardial infarction: Secondary | ICD-10-CM | POA: Diagnosis not present

## 2016-01-20 DIAGNOSIS — I10 Essential (primary) hypertension: Secondary | ICD-10-CM | POA: Diagnosis not present

## 2016-01-20 DIAGNOSIS — Z8673 Personal history of transient ischemic attack (TIA), and cerebral infarction without residual deficits: Secondary | ICD-10-CM | POA: Insufficient documentation

## 2016-01-20 DIAGNOSIS — Z7902 Long term (current) use of antithrombotics/antiplatelets: Secondary | ICD-10-CM | POA: Insufficient documentation

## 2016-01-20 DIAGNOSIS — I6529 Occlusion and stenosis of unspecified carotid artery: Secondary | ICD-10-CM | POA: Diagnosis present

## 2016-01-20 DIAGNOSIS — I739 Peripheral vascular disease, unspecified: Secondary | ICD-10-CM | POA: Insufficient documentation

## 2016-01-20 DIAGNOSIS — Z955 Presence of coronary angioplasty implant and graft: Secondary | ICD-10-CM | POA: Insufficient documentation

## 2016-01-20 DIAGNOSIS — Z7982 Long term (current) use of aspirin: Secondary | ICD-10-CM | POA: Diagnosis not present

## 2016-01-20 DIAGNOSIS — F17210 Nicotine dependence, cigarettes, uncomplicated: Secondary | ICD-10-CM | POA: Diagnosis not present

## 2016-01-20 DIAGNOSIS — Z95820 Peripheral vascular angioplasty status with implants and grafts: Secondary | ICD-10-CM | POA: Diagnosis not present

## 2016-01-20 HISTORY — PX: PERIPHERAL VASCULAR CATHETERIZATION: SHX172C

## 2016-01-20 LAB — POCT I-STAT, CHEM 8
BUN: 15 mg/dL (ref 6–20)
Calcium, Ion: 1.21 mmol/L (ref 1.13–1.30)
Chloride: 105 mmol/L (ref 101–111)
Creatinine, Ser: 1.2 mg/dL (ref 0.61–1.24)
Glucose, Bld: 94 mg/dL (ref 65–99)
HEMATOCRIT: 38 % — AB (ref 39.0–52.0)
HEMOGLOBIN: 12.9 g/dL — AB (ref 13.0–17.0)
POTASSIUM: 3.9 mmol/L (ref 3.5–5.1)
SODIUM: 141 mmol/L (ref 135–145)
TCO2: 26 mmol/L (ref 0–100)

## 2016-01-20 LAB — POCT ACTIVATED CLOTTING TIME: Activated Clotting Time: 322 seconds

## 2016-01-20 LAB — MRSA PCR SCREENING: MRSA by PCR: NEGATIVE

## 2016-01-20 SURGERY — CAROTID PTA/STENT INTERVENTION
Laterality: Right

## 2016-01-20 MED ORDER — ALUM & MAG HYDROXIDE-SIMETH 200-200-20 MG/5ML PO SUSP: 15.0000 mL | ORAL | Status: DC | PRN

## 2016-01-20 MED ORDER — BIVALIRUDIN 250 MG IV SOLR
INTRAVENOUS | Status: AC
  Filled 2016-01-20: qty 250

## 2016-01-20 MED ORDER — LISINOPRIL 20 MG PO TABS
20.0000 mg | ORAL_TABLET | Freq: Every day | ORAL | Status: DC
  Administered 2016-01-20 – 2016-01-21 (×2): 20 mg via ORAL
  Filled 2016-01-20 (×2): qty 1

## 2016-01-20 MED ORDER — TICAGRELOR 90 MG PO TABS
90.0000 mg | ORAL_TABLET | Freq: Two times a day (BID) | ORAL | Status: DC
  Administered 2016-01-20 – 2016-01-21 (×2): 90 mg via ORAL
  Filled 2016-01-20 (×2): qty 1

## 2016-01-20 MED ORDER — INFLUENZA VAC SPLIT QUAD 0.5 ML IM SUSY
0.5000 mL | PREFILLED_SYRINGE | INTRAMUSCULAR | Status: AC
  Administered 2016-01-21: 0.5 mL via INTRAMUSCULAR
  Filled 2016-01-20: qty 0.5

## 2016-01-20 MED ORDER — ONDANSETRON HCL 4 MG/2ML IJ SOLN: 4.0000 mg | Freq: Four times a day (QID) | INTRAMUSCULAR | Status: DC | PRN

## 2016-01-20 MED ORDER — ASPIRIN EC 81 MG PO TBEC
81.0000 mg | DELAYED_RELEASE_TABLET | Freq: Every day | ORAL | Status: DC
  Administered 2016-01-20 – 2016-01-21 (×2): 81 mg via ORAL
  Filled 2016-01-20 (×2): qty 1

## 2016-01-20 MED ORDER — LIDOCAINE HCL (PF) 1 % IJ SOLN
INTRAMUSCULAR | Status: DC | PRN
  Administered 2016-01-20: 15:00:00

## 2016-01-20 MED ORDER — MORPHINE SULFATE (PF) 2 MG/ML IV SOLN: 2.0000 mg | INTRAVENOUS | Status: DC | PRN

## 2016-01-20 MED ORDER — BIVALIRUDIN BOLUS VIA INFUSION - CUPID
INTRAVENOUS | Status: DC | PRN
Start: 2016-01-20 — End: 2016-01-20
  Administered 2016-01-20: 74.85 mg via INTRAVENOUS

## 2016-01-20 MED ORDER — HEPARIN (PORCINE) IN NACL 2-0.9 UNIT/ML-% IJ SOLN
INTRAMUSCULAR | Status: AC
  Filled 2016-01-20: qty 1000

## 2016-01-20 MED ORDER — DOCUSATE SODIUM 100 MG PO CAPS
100.0000 mg | ORAL_CAPSULE | Freq: Every day | ORAL | Status: DC
  Administered 2016-01-21: 100 mg via ORAL
  Filled 2016-01-20: qty 1

## 2016-01-20 MED ORDER — LABETALOL HCL 5 MG/ML IV SOLN: 10.0000 mg | INTRAVENOUS | Status: DC | PRN

## 2016-01-20 MED ORDER — TICAGRELOR 90 MG PO TABS
90.0000 mg | ORAL_TABLET | Freq: Once | ORAL | Status: AC
Start: 2016-01-20 — End: 2016-01-20
  Administered 2016-01-20: 90 mg via ORAL

## 2016-01-20 MED ORDER — TICAGRELOR 90 MG PO TABS
ORAL_TABLET | ORAL | Status: AC
  Filled 2016-01-20: qty 1

## 2016-01-20 MED ORDER — HYDRALAZINE HCL 20 MG/ML IJ SOLN: 5.0000 mg | INTRAMUSCULAR | Status: DC | PRN

## 2016-01-20 MED ORDER — ASPIRIN 81 MG PO CHEW
CHEWABLE_TABLET | ORAL | Status: AC
  Administered 2016-01-20: 81 mg via ORAL
  Filled 2016-01-20: qty 1

## 2016-01-20 MED ORDER — PHENOL 1.4 % MT LIQD: 1.0000 | OROMUCOSAL | Status: DC | PRN

## 2016-01-20 MED ORDER — CARVEDILOL 6.25 MG PO TABS
6.2500 mg | ORAL_TABLET | Freq: Two times a day (BID) | ORAL | Status: DC
  Administered 2016-01-21: 6.25 mg via ORAL
  Filled 2016-01-20: qty 1

## 2016-01-20 MED ORDER — LIDOCAINE HCL (PF) 1 % IJ SOLN
INTRAMUSCULAR | Status: AC
  Filled 2016-01-20: qty 30

## 2016-01-20 MED ORDER — CHLORHEXIDINE GLUCONATE CLOTH 2 % EX PADS: 6.0000 | MEDICATED_PAD | Freq: Every day | CUTANEOUS | Status: DC

## 2016-01-20 MED ORDER — SODIUM CHLORIDE 0.9 % IV SOLN
250.0000 mg | INTRAVENOUS | Status: DC | PRN
  Administered 2016-01-20: 1.75 mg/kg/h via INTRAVENOUS

## 2016-01-20 MED ORDER — OXYCODONE-ACETAMINOPHEN 5-325 MG PO TABS: 1.0000 | ORAL_TABLET | ORAL | Status: DC | PRN

## 2016-01-20 MED ORDER — SODIUM CHLORIDE 0.9 % IV SOLN: INTRAVENOUS | Status: DC

## 2016-01-20 MED ORDER — GUAIFENESIN-DM 100-10 MG/5ML PO SYRP: 15.0000 mL | ORAL_SOLUTION | ORAL | Status: DC | PRN

## 2016-01-20 MED ORDER — ASPIRIN 81 MG PO CHEW
81.0000 mg | CHEWABLE_TABLET | Freq: Once | ORAL | Status: AC
  Administered 2016-01-20: 81 mg via ORAL

## 2016-01-20 MED ORDER — MUPIROCIN 2 % EX OINT: 1.0000 "application " | TOPICAL_OINTMENT | Freq: Two times a day (BID) | CUTANEOUS | Status: DC

## 2016-01-20 MED ORDER — IODIXANOL 320 MG/ML IV SOLN
INTRAVENOUS | Status: DC | PRN
  Administered 2016-01-20: 60 mL via INTRA_ARTERIAL

## 2016-01-20 MED ORDER — ACETAMINOPHEN 650 MG RE SUPP: 325.0000 mg | RECTAL | Status: DC | PRN

## 2016-01-20 MED ORDER — ACETAMINOPHEN 325 MG PO TABS: 325.0000 mg | ORAL_TABLET | ORAL | Status: DC | PRN

## 2016-01-20 MED ORDER — METOPROLOL TARTRATE 1 MG/ML IV SOLN: 2.0000 mg | INTRAVENOUS | Status: DC | PRN

## 2016-01-20 MED ORDER — SODIUM CHLORIDE 0.9 % IV SOLN
INTRAVENOUS | Status: DC
Start: 2016-01-20 — End: 2016-01-20
  Administered 2016-01-20: 12:00:00 via INTRAVENOUS

## 2016-01-20 MED ORDER — NITROGLYCERIN 0.4 MG SL SUBL: 0.4000 mg | SUBLINGUAL_TABLET | SUBLINGUAL | Status: DC | PRN

## 2016-01-20 MED ORDER — ATORVASTATIN CALCIUM 40 MG PO TABS
40.0000 mg | ORAL_TABLET | Freq: Every day | ORAL | Status: DC
  Administered 2016-01-20 – 2016-01-21 (×2): 40 mg via ORAL
  Filled 2016-01-20 (×2): qty 1

## 2016-01-20 SURGICAL SUPPLY — 14 items
BALLN VIATRAC 6X20X135 (BALLOONS) ×3
BALLOON VIATRAC 6X20X135 (BALLOONS) IMPLANT
DEVICE EMBOSHIELD NAV6 4.0-7.0 (WIRE) ×2 IMPLANT
KIT MICROINTRODUCER STIFF 5F (SHEATH) ×2 IMPLANT
KIT PV (KITS) ×3 IMPLANT
SHEATH PINNACLE 6F 10CM (SHEATH) ×2 IMPLANT
SHEATH SHUTTLE SELECT 6F (SHEATH) ×2 IMPLANT
SHIELD RADPAD SCOOP 12X17 (MISCELLANEOUS) ×2 IMPLANT
STENT XACT CAR 9X30X136 (Permanent Stent) ×2 IMPLANT
SYR MEDRAD MARK V 150ML (SYRINGE) ×3 IMPLANT
TRANSDUCER W/STOPCOCK (MISCELLANEOUS) ×3 IMPLANT
TRAY PV CATH (CUSTOM PROCEDURE TRAY) ×3 IMPLANT
WIRE AMPLATZ SSTIFF .035X260CM (WIRE) ×2 IMPLANT
WIRE BENTSON .035X145CM (WIRE) ×2 IMPLANT

## 2016-01-20 NOTE — Progress Notes (Signed)
Site area: Left FA Site Prior to Removal:  Level 0 Pressure Applied For:52min Manual:   yes Patient Status During Pull:  stable Post Pull Site:  Level 0 Post Pull Instructions Given:  yes Post Pull Pulses Present:palpable  Dressing Applied:  clear Bedrest begins @ 1700 till 2100 Comments:

## 2016-01-20 NOTE — Progress Notes (Signed)
Neuro assessment---upper and lower extremity strength equal bilaterally. Smile symmetrical, tongue midline. Speech clear and appropriate. PERRL 

## 2016-01-20 NOTE — Progress Notes (Addendum)
Neuro status unchanged-no deficits

## 2016-01-20 NOTE — Op Note (Signed)
    Patient name: Cameron Petty MRN: [id number] DOB: July 14, 1956 Sex: male  01/20/2016 Pre-operative Diagnosis: Carotid stenosis, right, asymptomatic Post-operative diagnosis:  Same Surgeon:  Durene Cal  Co_Surgeon: Erlene Quan Procedure Performed:  1.  Ultrasound-guided access, left femoral artery  2.  Right carotid stent with distal embolic protection     Indications:  The patient recently underwent angiography for lower extremity claudication and carotid stenosis.  He was found to have a right common carotid up artery stenosis which was not surgically accessible.  Dr. Jacinto Halim checked pullback pressures across the lesion suggesting a high-grade stenosis.  He comes in today for carotid stenting as he is not a candidate for carotid endarterectomy, given the location of the lesion  Procedure:  The patient was identified in the holding area and taken to room 8.  The patient was then placed supine on the table and prepped and draped in the usual sterile fashion.  A time out was called.  Ultrasound was used to evaluate the left common femoral artery.  It was patent .  A digital ultrasound image was acquired.  A micropuncture needle was used to access the left common femoral artery under ultrasound guidance.  An 018 wire was advanced without resistance and a micropuncture sheath was placed.  The 018 wire was removed and a benson wire was placed.  The micropuncture sheath was exchanged for a 6 french sheath.  A JB 1 catheter was used to select the innominate artery.  A Amplatz wire was then advanced into the distal common carotid artery.  This did have to cross the lesion because of its location.  Angiomax was started which was confirmed therapeutic with an ACT.  Next, a long 6 French sheath was advanced into the proximal common carotid artery.  A large Nava 6 filter was then deployed in the right internal carotid artery.  The lesion was primarily stented using a XACT 9 x 30.  It was postdilated with a 6  mm balloon.  Completion imaging revealed resolution of the stenosis which was felt to be 75% prior to intervention.  Filter was then retrieved.  Completion imaging showed resolution of the stenosis and no significant intracranial pathology however this will be separately dictated by the neuroradiology.  Wires and sheaths were removed and exchanged out for a short 6 [place] sheath.  The patient be taken the holding area for sheath pull once his coag profile corrects.  Impression:  #1  successful right carotid stenting with distal embolic protection using a 9 x 30 stent.  Preintervention stenosis was 75.  Post-was less than 10%.  #2  type I aortic arch   Juleen China, M.D. [facility] Office: [phone] Pager:  [phone]

## 2016-01-21 ENCOUNTER — Encounter (HOSPITAL_COMMUNITY): Payer: Self-pay | Admitting: Surgery

## 2016-01-21 ENCOUNTER — Other Ambulatory Visit: Payer: Self-pay | Admitting: *Deleted

## 2016-01-21 DIAGNOSIS — Z9862 Peripheral vascular angioplasty status: Secondary | ICD-10-CM

## 2016-01-21 DIAGNOSIS — I6521 Occlusion and stenosis of right carotid artery: Secondary | ICD-10-CM | POA: Diagnosis not present

## 2016-01-21 DIAGNOSIS — I6523 Occlusion and stenosis of bilateral carotid arteries: Secondary | ICD-10-CM

## 2016-01-21 NOTE — Progress Notes (Signed)
Discharge instructions reviewed with patient. Importance of follow up appointment reviewed along with signs and symptoms of infection. Patient is waiting for his cane to be delivered from home health, per case management they are on the way. Patient is anxious about leaving soon due to his stepdaughter taking him home during her lunch break. Patient is ready to discharge as soon as equipment arrives.

## 2016-01-21 NOTE — Care Management Note (Signed)
Case Management Note  Patient Details  Name: Cameron Petty MRN: [id number] Date of Birth: [birth year]  Subjective/Objective:      Date: 01/21/16 Spoke with patient at the bedside , pta indep.  Introduced self as Sports coach and explained role in discharge planning and how to be reached.  Verified patient lives in town, alone. Expressed potential need for cane. Verified patient anticipates to go home alone,  at time of discharge and has part-time supervision through [employer] at this time to best of their knowledge.  Patient confirmed needing help with their medication, states he does not have the money to get meds.  He goes to [facility] on [address], [place] called pharmacy and spoke with Lynden Ang the pharmacist , explained situation to her she states they will waive his copay this time for him, this information was explained to patient.  Patient  is driven by scat to MD appointments.  Verified patient has PCP Mirna Mires at the [facility]. Patient has transportation at discharge.    Plan: CM will continue to follow for discharge planning and [facility] resources.               Action/Plan:   Expected Discharge Date:                  Expected Discharge Plan:  Home/Self Care  In-House Referral:     Discharge planning Services  CM Consult  Post Acute Care Choice:    Choice offered to:     DME Arranged:    DME Agency:     HH Arranged:    HH Agency:     Status of Service:  Completed, signed off  Medicare Important Message Given:    Date Medicare IM Given:    Medicare IM give by:    Date Additional Medicare IM Given:    Additional Medicare Important Message give by:     If discussed at Long Length of Stay Meetings, dates discussed:    Additional Comments:  Leone Haven, RN 01/21/2016, 10:20 AM

## 2016-01-21 NOTE — H&P (Signed)
Subjective:    Patient ID: Cameron Petty, male DOB: Sep 26, 1956, 60 y.o. MRN: [id number]  HPI this 60 year old male was referred by Dr. Yates Decamp for evaluation of possible right carotid stent and left right femoral-femoral bypass. Patient suffered a right brain stroke in 2007 and also had PTCA and stenting performed at that time. Since then he has been on Brilenta. He was found recently by Dr. Jacinto Halim to have significant bilateral iliac occlusive disease and he performed left iliac stenting successfully. Patient has occlusion of his right iliac system and needs a left right femoral-femoral bypass. He has limiting claudication after walking 20-30 feet he must stop walking. He has no history of gangrene or nonhealing ulcers in the right foot. 2 months ago he suffered a right brain TIA consisting of clumsiness in the left upper extremity and weakness in the left facial area. This resolved and did not result in a CVA. Patient is on aspirin. He was referred for further treatment.  Past Medical History  Diagnosis Date  . Hypertension   . Stroke [facility]) 2007    mini stroke, no residule now  . Myocardial infarction (HCC) 2007  . Coronary artery disease   . Carotid artery occlusion     Social History  Substance Use Topics  . Smoking status: Current Some Day Smoker -- 0.50 packs/day for 30 years    Types: Cigarettes  . Smokeless tobacco: Never Used  . Alcohol Use: No     Comment: occasionally    Family History  Problem Relation Age of Onset  . Heart disease Brother   . Cancer Maternal Uncle     breast    Allergies  Allergen Reactions  . Penicillins Rash     Current outpatient prescriptions:  . aspirin EC 81 MG tablet, Take 81 mg by mouth daily., Disp: , Rfl:  . atorvastatin (LIPITOR) 40 MG tablet, Take 40 mg by mouth daily., Disp: , Rfl:  . carvedilol (COREG) 6.25 MG tablet, Take 1 tablet (6.25 mg total) by  mouth 2 (two) times daily with a meal., Disp: 60 tablet, Rfl: 3 . lisinopril (PRINIVIL,ZESTRIL) 20 MG tablet, Take 20 mg by mouth daily., Disp: , Rfl:  . nitroGLYCERIN (NITROSTAT) 0.4 MG SL tablet, Place 1 tablet (0.4 mg total) under the tongue every 5 (five) minutes x 3 doses as needed for chest pain., Disp: 25 tablet, Rfl: 12 . ticagrelor (BRILINTA) 90 MG TABS tablet, Take 1 tablet (90 mg total) by mouth 2 (two) times daily., Disp: 60 tablet, Rfl: 11 . triamcinolone (KENALOG) 0.025 % ointment, Apply 1 application topically 2 (two) times daily., Disp: 30 g, Rfl: 0  Filed Vitals:   12/09/15 1020 12/09/15 1025  BP: 111/65 103/65  Pulse: 79 80  Temp: 98 F (36.7 C)   Resp: 18   Height:  (1.778 m)   Weight: 218 lb (98.884 kg)   SpO2: 99%     Body mass index is 31.28 kg/(m^2).           Review of Systems denies chest pain, dyspnea on exertion, PND, orthopnea, hemoptysis-see history of present illness. Other systems negative and complete review of systems    Objective:   Physical Exam BP 103/65 mmHg  Pulse 80  Temp(Src) 98 F (36.7 C)  Resp 18  Ht  (1.778 m)  Wt 218 lb (98.884 kg)  BMI 31.28 kg/m2  SpO2 99%  Gen.-alert and oriented x3 in no apparent distress HEENT normal for age Lungs no rhonchi or  wheezing Cardiovascular regular rhythm no murmurs carotid pulses 3+ palpable partial bruit right carotid artery and supraclavicular area Abdomen soft nontender no palpable masses Musculoskeletal free of major deformities Skin clear -no rashes Neurologic normal Lower extremities 3+ femoral and posterior tibial pulse palpable left leg Absent right femoral and distal pulses.  Today I reviewed the angiograms performed by Dr.Ganji including cerebral angiogram and aortobifemoral with runoff. I agree patient does have an 80% tapered stenosis of the proximal right common carotid artery just above the clavicle. He also has total  occlusion of the right iliac system with good runoff distally.       Assessment:     #1 right common carotid stenosis with recent right brain TIA #2 total occlusion right iliac system with previous stenting left iliac system causing severe claudication and right leg limiting patient to walking about 10-20 yards Remote history of coronary artery disease with PTCA and stenting in 2007 Remote history of right brain CVA in 2007 Hypertension    Plan:     Discussed situation with Dr. Myra Gianotti who will review cerebral angiomas further and make decision regarding possible right common carotid stent We will then proceed with left right femoral-femoral bypass grafting to relieve his right leg symptoms Discuss this with patient and he is agreeable and we will be in touch with him regarding the plans           No interval change.  Specifically, no neurologic symptoms.  Continues to have right leg claudication PE:  Palpable left femoral pulse Neuro intact Abd soft Plan:  Right carotid stent.  Risks and benefits discussed  Durene Cal

## 2016-01-21 NOTE — Progress Notes (Signed)
[facility]  Subjective  - doing well and ready to go home   Objective 112/59 79 98.1 F (36.7 C) (Oral) 14 99%  Intake/Output Summary (Last 24 hours) at 01/21/16 0909 Last data filed at 01/21/16 0700  Gross per 24 hour  Intake 1544.17 ml  Output   1600 ml  Net -55.83 ml    Groin soft without hematoma Palpable radial pulses bilaterally No vision changes or extremity weakness    Assessment/Planning: POD # 1 Procedure Performed: 1. Ultrasound-guided access, left femoral artery 2. Right carotid stent with distal embolic protection   Discharge home continue current medications to include  Brilinta F/U in the office in 3 weeks with carotid duplex.  Clinton Gallant [facility] 01/21/2016 9:09 AM --  Laboratory Lab Results:  Recent Labs  01/20/16 1148  HGB 12.9*  HCT 38.0*   BMET  Recent Labs  01/20/16 1148  NA 141  K 3.9  CL 105  GLUCOSE 94  BUN 15  CREATININE 1.20    COAG Lab Results  Component Value Date   INR 1.03 04/16/2015   INR 0.9 09/04/2008   INR 1.0 11/21/2007   No results found for: PTT

## 2016-01-22 NOTE — Discharge Summary (Signed)
[facility] Discharge Summary   Patient ID:  Cameron Petty MRN: [id number] DOB/AGE: 61-20-1957 61 y.o.  Admit date: 01/20/2016 Discharge date: 01/22/2016 Date of Surgery: 01/20/2016 Surgeon: Surgeon(s): Nada Libman, MD  Admission Diagnosis: right carotid stenosis  Discharge Diagnoses:  right carotid stenosis  Secondary Diagnoses: Past Medical History  Diagnosis Date  . Hypertension   . Stroke [facility]) 2007    mini stroke, no residule now  . Myocardial infarction (HCC) 2007  . Coronary artery disease   . Carotid artery occlusion     Procedure(s): Carotid PTA/Stent Intervention  Discharged Condition: good  HPI:  this 60 year old male was referred by Dr. Yates Decamp for evaluation of possible right carotid stent and left right femoral-femoral bypass. Patient suffered a right brain stroke in 2007 and also had PTCA and stenting performed at that time. Since then he has been on Brilenta. He was found recently by Dr. Jacinto Halim to have significant bilateral iliac occlusive disease and he performed left iliac stenting successfully. Patient has occlusion of his right iliac system and needs a left right femoral-femoral bypass. He has limiting claudication after walking 20-30 feet he must stop walking. He has no history of gangrene or nonhealing ulcers in the right foot. 2 months ago he suffered a right brain TIA consisting of clumsiness in the left upper extremity and weakness in the left facial area. This resolved and did not result in a CVA. Patient is on aspirin. He was referred for further treatment.  Dr. Myra Gianotti reviewed the angiograms performed by [facility] including cerebral angiogram and aortobifemoral with runoff. He  Agreed that the  patient does have an 80% tapered stenosis of the proximal right common carotid artery just above the clavicle. He also has total occlusion of the right iliac system with good runoff distally.  Hospital Course:  Cameron Petty is a  60 y.o. male is S/P Right Procedure(s): Carotid PTA/Stent Intervention His stay was uneventful and he was discharged home in stable condition.      Significant Diagnostic Studies: CBC Lab Results  Component Value Date   WBC 8.1 04/18/2015   HGB 12.9* 01/20/2016   HCT 38.0* 01/20/2016   MCV 86.4 04/18/2015   PLT 264 04/18/2015    BMET    Component Value Date/Time   NA 141 01/20/2016 1148   K 3.9 01/20/2016 1148   CL 105 01/20/2016 1148   CO2 21 04/18/2015 0414   GLUCOSE 94 01/20/2016 1148   BUN 15 01/20/2016 1148   CREATININE 1.20 01/20/2016 1148   CALCIUM 8.2* 04/18/2015 0414   GFRNONAA 65* 04/18/2015 0414   GFRAA 76* 04/18/2015 0414   COAG Lab Results  Component Value Date   INR 1.03 04/16/2015   INR 0.9 09/04/2008   INR 1.0 11/21/2007     Disposition:  Discharge to :Home Discharge Instructions    Call MD for:  redness, tenderness, or signs of infection (pain, swelling, bleeding, redness, odor or green/yellow discharge around incision site)    Complete by:  As directed      Call MD for:  severe or increased pain, loss or decreased feeling  in affected limb(s)    Complete by:  As directed      Call MD for:  temperature >100.5    Complete by:  As directed      Discharge instructions    Complete by:  As directed   You may shower in 24 hours     Driving Restrictions  Complete by:  As directed   No driving for 48 hours     Lifting restrictions    Complete by:  As directed   No lifting for 2 weeks     Resume previous diet    Complete by:  As directed             Medication List    TAKE these medications        aspirin EC 81 MG tablet  Take 81 mg by mouth daily.     atorvastatin 40 MG tablet  Commonly known as:  LIPITOR  Take 40 mg by mouth daily.     carvedilol 6.25 MG tablet  Commonly known as:  COREG  Take 1 tablet (6.25 mg total) by mouth 2 (two) times daily with a meal.     lisinopril 20 MG tablet  Commonly known as:  PRINIVIL,ZESTRIL   Take 20 mg by mouth daily.     nitroGLYCERIN 0.4 MG SL tablet  Commonly known as:  NITROSTAT  Place 1 tablet (0.4 mg total) under the tongue every 5 (five) minutes x 3 doses as needed for chest pain.     ticagrelor 90 MG Tabs tablet  Commonly known as:  BRILINTA  Take 1 tablet (90 mg total) by mouth 2 (two) times daily.       Verbal and written Discharge instructions given to the patient. Wound care per Discharge AVS     Follow-up Information    [facility] with [facility]   Why:  single point cane   Contact information:   [address] [place] [address] [phone]       Follow up with Durene Cal, MD In 3 weeks.   Specialties:  Vascular Surgery, Cardiology   Why:  Office will call you to arrange your appt (sent)   Contact information:   [address] [place] [address] [phone]       Signed: Clinton Gallant [facility] 01/22/2016, 4:09 PM

## 2016-01-23 ENCOUNTER — Telehealth: Payer: Self-pay | Admitting: Vascular Surgery

## 2016-01-23 NOTE — Telephone Encounter (Signed)
-----   Message from Fredrich Birks sent at 01/22/2016  2:30 PM EST ----- Regarding: FW: schedule   ----- Message -----    From: Sharee Pimple, RN    Sent: 01/21/2016   1:48 PM      To: [facility] Admin Pool Subject: schedule                                         ----- Message -----    From: Lars Mage, PA-C    Sent: 01/21/2016  11:06 AM      To: [facility] Charge Pool  F/U with Dr. Myra Gianotti in 3 weeks needs carotid duplex at visit. S/P right carotid stent

## 2016-01-23 NOTE — Telephone Encounter (Signed)
Spoke with pt to schedule, dpm °

## 2016-02-03 ENCOUNTER — Encounter: Payer: Self-pay | Admitting: Surgery

## 2016-02-05 ENCOUNTER — Encounter (HOSPITAL_COMMUNITY): Payer: Medicaid Other

## 2016-02-09 ENCOUNTER — Encounter: Payer: Medicaid Other | Admitting: Surgery

## 2016-02-24 ENCOUNTER — Encounter: Payer: Self-pay | Admitting: Surgery

## 2016-03-01 ENCOUNTER — Encounter (HOSPITAL_COMMUNITY): Payer: Medicaid Other

## 2016-03-01 ENCOUNTER — Encounter: Payer: Medicaid Other | Admitting: Surgery

## 2016-04-26 DEATH — deceased
